# Patient Record
Sex: Female | Born: 1965 | Race: Black or African American | Hispanic: No | Marital: Married | Smoking: Never smoker
Health system: Southern US, Community
[De-identification: ages and names within clinical notes are randomized; demographics above are authoritative.]

## PROBLEM LIST (undated history)

## (undated) DIAGNOSIS — D649 Anemia, unspecified: Secondary | ICD-10-CM

## (undated) HISTORY — DX: Anemia, unspecified: D64.9

## (undated) HISTORY — PX: BACK SURGERY: SHX140

## (undated) HISTORY — PX: TONSILLECTOMY: SUR1361

---

## 1998-05-05 ENCOUNTER — Encounter: Payer: Self-pay | Admitting: Family Medicine

## 1998-05-05 ENCOUNTER — Observation Stay (HOSPITAL_COMMUNITY): Admission: AD | Admit: 1998-05-05 | Discharge: 1998-05-06 | Payer: Self-pay | Admitting: Family Medicine

## 2001-10-11 ENCOUNTER — Emergency Department (HOSPITAL_COMMUNITY): Admission: EM | Admit: 2001-10-11 | Discharge: 2001-10-11 | Payer: Self-pay | Admitting: Emergency Medicine

## 2005-06-22 ENCOUNTER — Emergency Department (HOSPITAL_COMMUNITY): Admission: EM | Admit: 2005-06-22 | Discharge: 2005-06-22 | Payer: Self-pay | Admitting: Emergency Medicine

## 2005-07-07 ENCOUNTER — Ambulatory Visit: Payer: Self-pay | Admitting: Orthopedic Surgery

## 2007-04-03 ENCOUNTER — Encounter: Admission: RE | Admit: 2007-04-03 | Discharge: 2007-04-03 | Payer: Self-pay | Admitting: Neurosurgery

## 2007-04-04 ENCOUNTER — Emergency Department (HOSPITAL_COMMUNITY): Admission: EM | Admit: 2007-04-04 | Discharge: 2007-04-05 | Payer: Self-pay | Admitting: Emergency Medicine

## 2007-04-11 ENCOUNTER — Ambulatory Visit (HOSPITAL_COMMUNITY): Admission: RE | Admit: 2007-04-11 | Discharge: 2007-04-12 | Payer: Self-pay | Admitting: Neurosurgery

## 2007-04-21 ENCOUNTER — Emergency Department (HOSPITAL_COMMUNITY): Admission: EM | Admit: 2007-04-21 | Discharge: 2007-04-21 | Payer: Self-pay | Admitting: Emergency Medicine

## 2007-05-30 ENCOUNTER — Encounter: Admission: RE | Admit: 2007-05-30 | Discharge: 2007-05-30 | Payer: Self-pay | Admitting: Neurosurgery

## 2007-06-05 ENCOUNTER — Ambulatory Visit (HOSPITAL_COMMUNITY): Admission: RE | Admit: 2007-06-05 | Discharge: 2007-06-05 | Payer: Self-pay | Admitting: Family Medicine

## 2008-07-21 IMAGING — RF DG CERVICAL SPINE 2 OR 3 VIEWS
1 series · 1 of 1 positions shown · non-contrast
Comparison: none

CLINICAL DATA: HNP, C5-6 ACDF.
 CERVICAL SPINE:

[Series 1: run · 1 of 1 slices shown]
[im 1/1]
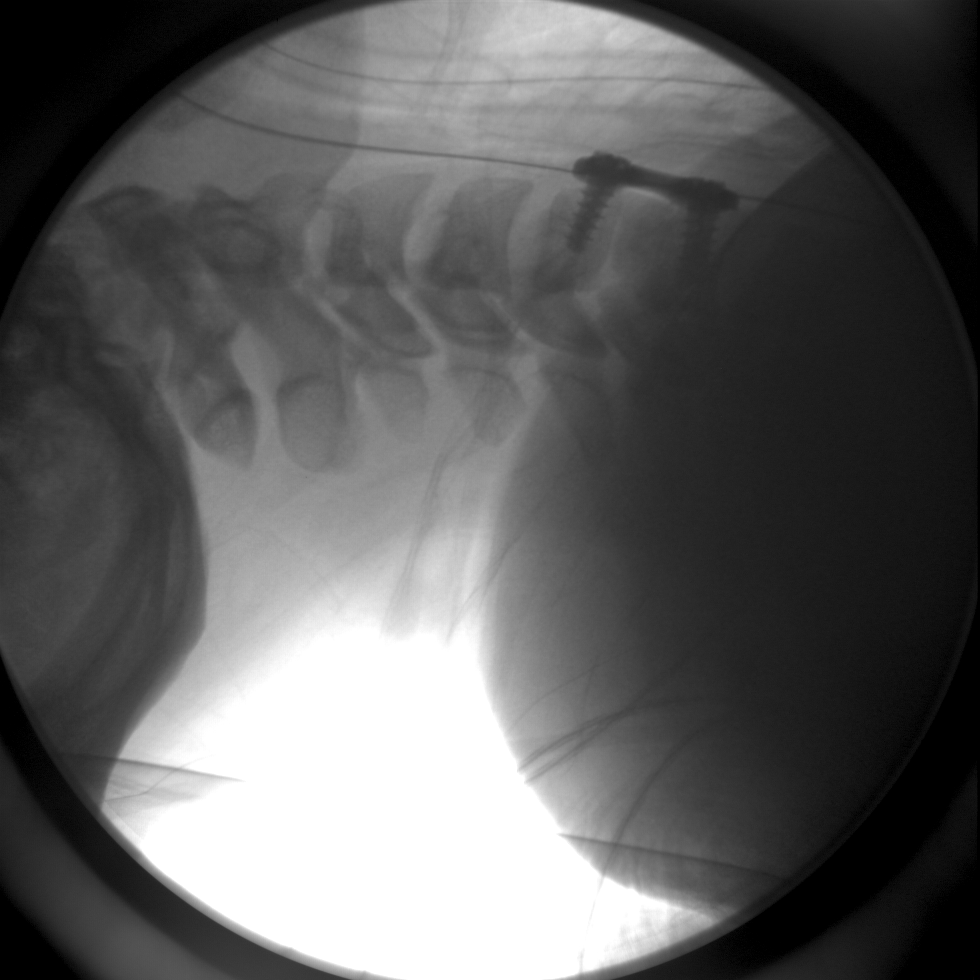

[1 of 1 positions shown; findings below may reference images not displayed]

FINDINGS: Anterior plate and screw fixation hardware and interbody fusion plug have been placed at C5-6.  Alignment appears satisfactory.
IMPRESSION: Status post ACDF at C5-6 with anterior plate and screw fixation hardware and IBF plug/spacer.

## 2008-08-23 ENCOUNTER — Emergency Department (HOSPITAL_COMMUNITY): Admission: EM | Admit: 2008-08-23 | Discharge: 2008-08-24 | Payer: Self-pay | Admitting: Emergency Medicine

## 2008-09-04 ENCOUNTER — Encounter: Admission: RE | Admit: 2008-09-04 | Discharge: 2008-10-02 | Payer: Self-pay | Admitting: Family Medicine

## 2008-10-04 ENCOUNTER — Emergency Department (HOSPITAL_COMMUNITY): Admission: EM | Admit: 2008-10-04 | Discharge: 2008-10-04 | Payer: Self-pay | Admitting: Emergency Medicine

## 2009-04-15 ENCOUNTER — Encounter: Payer: Self-pay | Admitting: Emergency Medicine

## 2009-04-15 ENCOUNTER — Ambulatory Visit: Payer: Self-pay | Admitting: Internal Medicine

## 2009-04-15 ENCOUNTER — Inpatient Hospital Stay (HOSPITAL_COMMUNITY): Admission: EM | Admit: 2009-04-15 | Discharge: 2009-04-17 | Payer: Self-pay | Admitting: Pediatrics

## 2009-04-16 ENCOUNTER — Encounter (INDEPENDENT_AMBULATORY_CARE_PROVIDER_SITE_OTHER): Payer: Self-pay | Admitting: Pediatrics

## 2009-04-16 ENCOUNTER — Ambulatory Visit: Payer: Self-pay | Admitting: Surgery

## 2009-04-25 ENCOUNTER — Inpatient Hospital Stay (HOSPITAL_COMMUNITY): Admission: EM | Admit: 2009-04-25 | Discharge: 2009-04-27 | Payer: Self-pay | Admitting: Emergency Medicine

## 2009-04-27 ENCOUNTER — Ambulatory Visit: Payer: Self-pay | Admitting: Psychiatry

## 2009-09-03 ENCOUNTER — Other Ambulatory Visit: Admission: RE | Admit: 2009-09-03 | Discharge: 2009-09-03 | Payer: Self-pay | Admitting: Family Medicine

## 2009-10-13 ENCOUNTER — Encounter: Admission: RE | Admit: 2009-10-13 | Discharge: 2009-10-13 | Payer: Self-pay | Admitting: Family Medicine

## 2010-09-09 LAB — CBC
HCT: 31 % — ABNORMAL LOW (ref 36.0–46.0)
HCT: 33.4 % — ABNORMAL LOW (ref 36.0–46.0)
HCT: 31.8 % — ABNORMAL LOW (ref 36.0–46.0)
Hemoglobin: 10.4 g/dL — ABNORMAL LOW (ref 12.0–15.0)
Hemoglobin: 11.3 g/dL — ABNORMAL LOW (ref 12.0–15.0)
Hemoglobin: 10.9 g/dL — ABNORMAL LOW (ref 12.0–15.0)
MCHC: 33.5 g/dL (ref 30.0–36.0)
MCHC: 33.7 g/dL (ref 30.0–36.0)
MCHC: 34.3 g/dL (ref 30.0–36.0)
MCV: 86.4 fL (ref 78.0–100.0)
MCV: 86.8 fL (ref 78.0–100.0)
MCV: 84.1 fL (ref 78.0–100.0)
Platelets: 262 10*3/uL (ref 150–400)
Platelets: 291 10*3/uL (ref 150–400)
Platelets: 292 10*3/uL (ref 150–400)
RBC: 3.57 MIL/uL — ABNORMAL LOW (ref 3.87–5.11)
RBC: 3.87 MIL/uL (ref 3.87–5.11)
RBC: 3.78 MIL/uL — ABNORMAL LOW (ref 3.87–5.11)
RDW: 17.4 % — ABNORMAL HIGH (ref 11.5–15.5)
RDW: 17.6 % — ABNORMAL HIGH (ref 11.5–15.5)
RDW: 16.7 % — ABNORMAL HIGH (ref 11.5–15.5)
WBC: 5.1 10*3/uL (ref 4.0–10.5)
WBC: 6.8 10*3/uL (ref 4.0–10.5)
WBC: 6.4 10*3/uL (ref 4.0–10.5)

## 2010-09-09 LAB — BASIC METABOLIC PANEL
BUN: 7 mg/dL (ref 6–23)
BUN: 8 mg/dL (ref 6–23)
BUN: 5 mg/dL — ABNORMAL LOW (ref 6–23)
CO2: 24 mEq/L (ref 19–32)
CO2: 25 mEq/L (ref 19–32)
CO2: 20 mEq/L (ref 19–32)
Calcium: 8.3 mg/dL — ABNORMAL LOW (ref 8.4–10.5)
Calcium: 8.4 mg/dL (ref 8.4–10.5)
Calcium: 8 mg/dL — ABNORMAL LOW (ref 8.4–10.5)
Chloride: 111 mEq/L (ref 96–112)
Chloride: 113 mEq/L — ABNORMAL HIGH (ref 96–112)
Chloride: 107 mEq/L (ref 96–112)
Creatinine, Ser: 0.7 mg/dL (ref 0.4–1.2)
Creatinine, Ser: 0.71 mg/dL (ref 0.4–1.2)
Creatinine, Ser: 0.67 mg/dL (ref 0.4–1.2)
GFR calc Af Amer: >60 mL/min (ref >60)
GFR calc Af Amer: >60 mL/min (ref >60)
GFR calc Af Amer: >60 mL/min (ref >60)
GFR calc non Af Amer: >60 mL/min (ref >60)
GFR calc non Af Amer: >60 mL/min (ref >60)
GFR calc non Af Amer: >60 mL/min (ref >60)
Glucose, Bld: 117 mg/dL — ABNORMAL HIGH (ref 70–99)
Glucose, Bld: 91 mg/dL (ref 70–99)
Glucose, Bld: 77 mg/dL (ref 70–99)
Potassium: 3.7 mEq/L (ref 3.5–5.1)
Potassium: 4 mEq/L (ref 3.5–5.1)
Potassium: 3.3 mEq/L — ABNORMAL LOW (ref 3.5–5.1)
Sodium: 141 mEq/L (ref 135–145)
Sodium: 142 mEq/L (ref 135–145)
Sodium: 134 mEq/L — ABNORMAL LOW (ref 135–145)

## 2010-09-09 LAB — POCT I-STAT, CHEM 8
BUN: 7 mg/dL (ref 6–23)
Calcium, Ion: 0.94 mmol/L — ABNORMAL LOW (ref 1.12–1.32)
Chloride: 110 mEq/L (ref 96–112)
Creatinine, Ser: 0.7 mg/dL (ref 0.4–1.2)
Glucose, Bld: 114 mg/dL — ABNORMAL HIGH (ref 70–99)
HCT: 34 % — ABNORMAL LOW (ref 36.0–46.0)
Hemoglobin: 11.6 g/dL — ABNORMAL LOW (ref 12.0–15.0)
Potassium: 3.1 mEq/L — ABNORMAL LOW (ref 3.5–5.1)
Sodium: 142 mEq/L (ref 135–145)
TCO2: 19 mmol/L (ref 0–100)

## 2010-09-09 LAB — HEMOGLOBIN A1C
Hgb A1c MFr Bld: 5.5 % (ref 4.6–6.1)
Mean Plasma Glucose: 111 mg/dL

## 2010-09-09 LAB — CARDIAC PANEL(CRET KIN+CKTOT+MB+TROPI)
CK, MB: 0.6 ng/mL (ref 0.3–4.0)
CK, MB: 3 ng/mL (ref 0.3–4.0)
Relative Index: INVALID (ref 0.0–2.5)
Relative Index: INVALID (ref 0.0–2.5)
Total CK: 72 U/L (ref 7–177)
Total CK: 74 U/L (ref 7–177)
Troponin I: 0.01 ng/mL (ref 0.00–0.06)
Troponin I: 0.01 ng/mL (ref 0.00–0.06)

## 2010-09-09 LAB — LIPID PANEL
Cholesterol: 135 mg/dL (ref 0–200)
Cholesterol: 157 mg/dL (ref 0–200)
HDL: 33 mg/dL — ABNORMAL LOW (ref >39)
HDL: 42 mg/dL (ref >39)
LDL Cholesterol: 87 mg/dL (ref 0–99)
LDL Cholesterol: 101 mg/dL — ABNORMAL HIGH (ref 0–99)
Total CHOL/HDL Ratio: 4.1 RATIO
Total CHOL/HDL Ratio: 3.7 RATIO
Triglycerides: 75 mg/dL (ref <150)
Triglycerides: 69 mg/dL (ref <150)
VLDL: 15 mg/dL (ref 0–40)
VLDL: 14 mg/dL (ref 0–40)

## 2010-09-09 LAB — ETHANOL: Alcohol, Ethyl (B): <5 mg/dL (ref 0–10)

## 2010-09-09 LAB — URINALYSIS, ROUTINE W REFLEX MICROSCOPIC
Bilirubin Urine: NEGATIVE
Bilirubin Urine: NEGATIVE
Glucose, UA: NEGATIVE mg/dL
Glucose, UA: NEGATIVE mg/dL
Hgb urine dipstick: NEGATIVE
Hgb urine dipstick: NEGATIVE
Ketones, ur: NEGATIVE mg/dL
Nitrite: NEGATIVE
Nitrite: NEGATIVE
Protein, ur: NEGATIVE mg/dL
Protein, ur: NEGATIVE mg/dL
Specific Gravity, Urine: 1.011 (ref 1.005–1.030)
Specific Gravity, Urine: >1.03 — ABNORMAL HIGH (ref 1.005–1.030)
Urobilinogen, UA: 1 mg/dL (ref 0.0–1.0)
Urobilinogen, UA: 0.2 mg/dL (ref 0.0–1.0)
pH: 7.5 (ref 5.0–8.0)
pH: 5.5 (ref 5.0–8.0)

## 2010-09-09 LAB — DIFFERENTIAL
Basophils Absolute: 0.1 10*3/uL (ref 0.0–0.1)
Basophils Absolute: 0 10*3/uL (ref 0.0–0.1)
Basophils Relative: 1 % (ref 0–1)
Basophils Relative: 1 % (ref 0–1)
Eosinophils Absolute: 0.1 10*3/uL (ref 0.0–0.7)
Eosinophils Absolute: 0.1 10*3/uL (ref 0.0–0.7)
Eosinophils Relative: 2 % (ref 0–5)
Eosinophils Relative: 2 % (ref 0–5)
Lymphocytes Relative: 38 % (ref 12–46)
Lymphocytes Relative: 40 % (ref 12–46)
Lymphs Abs: 2.6 10*3/uL (ref 0.7–4.0)
Lymphs Abs: 2.6 10*3/uL (ref 0.7–4.0)
Monocytes Absolute: 0.6 10*3/uL (ref 0.1–1.0)
Monocytes Absolute: 0.6 10*3/uL (ref 0.1–1.0)
Monocytes Relative: 9 % (ref 3–12)
Monocytes Relative: 9 % (ref 3–12)
Neutro Abs: 3.4 10*3/uL (ref 1.7–7.7)
Neutro Abs: 3.1 10*3/uL (ref 1.7–7.7)
Neutrophils Relative %: 50 % (ref 43–77)
Neutrophils Relative %: 49 % (ref 43–77)

## 2010-09-09 LAB — PHOSPHORUS
Phosphorus: 1.8 mg/dL — ABNORMAL LOW (ref 2.3–4.6)
Phosphorus: 2.7 mg/dL (ref 2.3–4.6)

## 2010-09-09 LAB — CK TOTAL AND CKMB (NOT AT ARMC)
CK, MB: 0.8 ng/mL (ref 0.3–4.0)
Relative Index: INVALID (ref 0.0–2.5)
Total CK: 89 U/L (ref 7–177)

## 2010-09-09 LAB — RAPID URINE DRUG SCREEN, HOSP PERFORMED
Amphetamines: NOT DETECTED
Amphetamines: NOT DETECTED
Barbiturates: NOT DETECTED
Barbiturates: NOT DETECTED
Benzodiazepines: NOT DETECTED
Benzodiazepines: NOT DETECTED
Cocaine: NOT DETECTED
Cocaine: NOT DETECTED
Opiates: NOT DETECTED
Opiates: NOT DETECTED
Tetrahydrocannabinol: NOT DETECTED
Tetrahydrocannabinol: NOT DETECTED

## 2010-09-09 LAB — POCT CARDIAC MARKERS
CKMB, poc: <1 ng/mL — ABNORMAL LOW (ref 1.0–8.0)
Myoglobin, poc: 48.3 ng/mL (ref 12–200)
Troponin i, poc: <0.05 ng/mL (ref 0.00–0.09)

## 2010-09-09 LAB — COMPREHENSIVE METABOLIC PANEL
ALT: 14 U/L (ref 0–35)
AST: 16 U/L (ref 0–37)
Albumin: 3.4 g/dL — ABNORMAL LOW (ref 3.5–5.2)
Alkaline Phosphatase: 68 U/L (ref 39–117)
BUN: 10 mg/dL (ref 6–23)
CO2: 26 mEq/L (ref 19–32)
Calcium: 8.6 mg/dL (ref 8.4–10.5)
Chloride: 111 mEq/L (ref 96–112)
Creatinine, Ser: 0.84 mg/dL (ref 0.4–1.2)
GFR calc Af Amer: >60 mL/min (ref >60)
GFR calc non Af Amer: >60 mL/min (ref >60)
Glucose, Bld: 82 mg/dL (ref 70–99)
Potassium: 3.2 mEq/L — ABNORMAL LOW (ref 3.5–5.1)
Sodium: 140 mEq/L (ref 135–145)
Total Bilirubin: 0.2 mg/dL — ABNORMAL LOW (ref 0.3–1.2)
Total Protein: 6.6 g/dL (ref 6.0–8.3)

## 2010-09-09 LAB — PROTIME-INR
INR: 1.03 (ref 0.00–1.49)
INR: 1.05 (ref 0.00–1.49)
Prothrombin Time: 13.4 seconds (ref 11.6–15.2)
Prothrombin Time: 13.6 seconds (ref 11.6–15.2)

## 2010-09-09 LAB — MRSA PCR SCREENING: MRSA by PCR: NEGATIVE

## 2010-09-09 LAB — CALCIUM: Calcium: 8.3 mg/dL — ABNORMAL LOW (ref 8.4–10.5)

## 2010-09-09 LAB — MAGNESIUM
Magnesium: 2 mg/dL (ref 1.5–2.5)
Magnesium: 2 mg/dL (ref 1.5–2.5)

## 2010-09-09 LAB — SEDIMENTATION RATE
Sed Rate: 12 mm/hr (ref 0–22)
Sed Rate: 15 mm/hr (ref 0–22)

## 2010-09-09 LAB — APTT
aPTT: 29 seconds (ref 24–37)
aPTT: 29 seconds (ref 24–37)

## 2010-09-09 LAB — GLUCOSE, CAPILLARY
Glucose-Capillary: 103 mg/dL — ABNORMAL HIGH (ref 70–99)
Glucose-Capillary: 104 mg/dL — ABNORMAL HIGH (ref 70–99)
Glucose-Capillary: 90 mg/dL (ref 70–99)
Glucose-Capillary: 91 mg/dL (ref 70–99)

## 2010-09-09 LAB — POCT PREGNANCY, URINE: Preg Test, Ur: NEGATIVE

## 2010-09-09 LAB — TSH: TSH: 6.087 u[IU]/mL — ABNORMAL HIGH (ref 0.350–4.500)

## 2010-09-09 LAB — TROPONIN I: Troponin I: <0.01 ng/mL (ref 0.00–0.06)

## 2010-09-16 LAB — CBC
HCT: 32.7 % — ABNORMAL LOW (ref 36.0–46.0)
Hemoglobin: 10.9 g/dL — ABNORMAL LOW (ref 12.0–15.0)
MCHC: 33.3 g/dL (ref 30.0–36.0)
MCV: 84.2 fL (ref 78.0–100.0)
Platelets: 316 10*3/uL (ref 150–400)
RBC: 3.88 MIL/uL (ref 3.87–5.11)
RDW: 15.9 % — ABNORMAL HIGH (ref 11.5–15.5)
WBC: 5.3 10*3/uL (ref 4.0–10.5)

## 2010-09-16 LAB — DIFFERENTIAL
Basophils Absolute: 0.1 10*3/uL (ref 0.0–0.1)
Basophils Relative: 1 % (ref 0–1)
Eosinophils Absolute: 0.1 10*3/uL (ref 0.0–0.7)
Eosinophils Relative: 2 % (ref 0–5)
Lymphocytes Relative: 38 % (ref 12–46)
Lymphs Abs: 2 10*3/uL (ref 0.7–4.0)
Monocytes Absolute: 0.4 10*3/uL (ref 0.1–1.0)
Monocytes Relative: 7 % (ref 3–12)
Neutro Abs: 2.7 10*3/uL (ref 1.7–7.7)
Neutrophils Relative %: 52 % (ref 43–77)

## 2010-09-16 LAB — COMPREHENSIVE METABOLIC PANEL
ALT: 14 U/L (ref 0–35)
AST: 15 U/L (ref 0–37)
Albumin: 3.5 g/dL (ref 3.5–5.2)
Alkaline Phosphatase: 78 U/L (ref 39–117)
BUN: 10 mg/dL (ref 6–23)
CO2: 26 mEq/L (ref 19–32)
Calcium: 8.6 mg/dL (ref 8.4–10.5)
Chloride: 108 mEq/L (ref 96–112)
Creatinine, Ser: 0.76 mg/dL (ref 0.4–1.2)
GFR calc Af Amer: >60 mL/min (ref >60)
GFR calc non Af Amer: >60 mL/min (ref >60)
Glucose, Bld: 88 mg/dL (ref 70–99)
Potassium: 3.5 mEq/L (ref 3.5–5.1)
Sodium: 140 mEq/L (ref 135–145)
Total Bilirubin: 0.6 mg/dL (ref 0.3–1.2)
Total Protein: 7.3 g/dL (ref 6.0–8.3)

## 2010-09-16 LAB — D-DIMER, QUANTITATIVE: D-Dimer, Quant: 0.22 ug/mL-FEU (ref 0.00–0.48)

## 2010-09-16 LAB — POCT CARDIAC MARKERS
CKMB, poc: <1 ng/mL — ABNORMAL LOW (ref 1.0–8.0)
Myoglobin, poc: 39.7 ng/mL (ref 12–200)
Troponin i, poc: <0.05 ng/mL (ref 0.00–0.09)

## 2010-10-20 NOTE — Op Note (Signed)
[name], [name]                 ACCOUNT NO.:  [id number]   MEDICAL RECORD NO.:  [id number]          PATIENT TYPE:  OIB   LOCATION:  5041                         FACILITY:  [facility]   PHYSICIAN:  Henry A. Pool, M.D.    DATE OF BIRTH:  08-01-1965   DATE OF PROCEDURE:  04/12/2007  DATE OF DISCHARGE:                               OPERATIVE REPORT   SERVICE:  Neurosurgery.   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   PROCEDURE NOTE:  C5-6 anterior cervical diskectomy fusion with allograft  anterior plating.   SURGEON:  Kathaleen Maser. Pool, M.D.   ASSISTANT:  Reinaldo Meeker, M.D.   ANESTHESIA:  General orotracheal.   INDICATIONS:  [name] is a 45 year old female with history of severe  neck and left upper extremity pain, paresthesias, and weakness.  Workup  demonstrates evidence of a large, left-sided C5-6 disk herniation with  marked spinal cord compression.  The patient has been counseled as to  her options.  She decided proceed with a C5-6 anterior cervical  discectomy-fusion with allograft anterior plating in the hopes of  improving her symptoms.   OPERATING NOTE:  The patient was brought to the operating room and  placed on the operating table in the supine position.  After an adequate  level of anesthesia was achieved the patient was turned supine and neck  was extended, and held in place with halter traction.  The patient's  anterior cervical region was prepped and draped sterilely.   A 10-blade was used to make a skin incision overlying the C5-6  interspace.  This was carried down sharply to the platysma.  The  platysma was divided vertically, and dissection was extended on the  medial border of the sternomastoid muscle and carotid sheath.  Trachea  and esophagus were mobilized and tracked towards the left.  Prevertebral  fascia was stripped off the anterior spinal  column.  Longus colli muscle  was elevated bilaterally using electrocautery.  Deep self-retractor was  placed.  Intraoperative fluoroscopy was used and the level was  confirmed.   Disk space at C5-6 then incised with a 15-blade in a standard fashion  and a wide disk space plane was then achieved using pituitary rongeurs  four in the background __________  curettes, Kerrison rongeurs, and a  high-speed drill.   Elements of the disk were removed down close to the posterior annulus  where microscope was brought up in the field and used throughout the  remainder of the discectomy.  The remaining aspects of the annulus and  osteophytes were removed using high-speed drill down to the level of the  posterior __________ .  The posterior __________  was elevated and  resected in the usual fashion using Kerrison rongeurs.  A large amount  of free disk herniation was encountered and completely resected.   Decompression then proceeded throughout the canal by undercutting the  bodies of C5 and C6 for decompression that presented in each neural  foramen.  Anterior foraminotomy was then performed  through the course of  the exiting C6 nerve roots bilaterally.  At this point, a very thorough  decompression was achieved.  There was no injury to the thecal sac and  nerve roots appeared through the length of disk herniation completely  resected.   The wound was then irrigated with antibiotic solution.  A 6 mm Life  allograft was then packed into place successfully with a 1 mm of  anterior cortical margin.  A 25 mm Atlantis anterior plate was then  placed over the C5 and C6 levels.  This was then attached under  fluoroscopic guidance using 13 mm variable screws to each at both levels  with all 4 screws, given a final tightening __________  in bone.  A  locking screw was engaged in both levels.  Final images revealed good  position __________  normal spine.  The wound was then irrigated one  final time.   Hemostasis was ensured with bipolar electrocautery.  The  wound was then closed in layers.  Sterile dressing was applied.  There  were no intraoperative complications.  The patient tolerated the  procedure well, and she returns to the recovery room postoperatively in  good condition.           ______________________________  Kathaleen Maser Pool, M.D.     HAP/MEDQ  D:  04/11/2007  T:  04/12/2007  Job:  [id number]

## 2011-01-06 ENCOUNTER — Other Ambulatory Visit (HOSPITAL_COMMUNITY): Payer: Self-pay | Admitting: Family Medicine

## 2011-01-06 DIAGNOSIS — Z139 Encounter for screening, unspecified: Secondary | ICD-10-CM

## 2011-01-18 ENCOUNTER — Ambulatory Visit (HOSPITAL_COMMUNITY)
Admission: RE | Admit: 2011-01-18 | Discharge: 2011-01-18 | Disposition: A | Discharge status: Home / Self Care | Payer: BC Managed Care – PPO | Source: Ambulatory Visit | Attending: Family Medicine | Admitting: Family Medicine

## 2011-01-18 DIAGNOSIS — Z1231 Encounter for screening mammogram for malignant neoplasm of breast: Secondary | ICD-10-CM | POA: Insufficient documentation

## 2011-01-18 DIAGNOSIS — Z139 Encounter for screening, unspecified: Secondary | ICD-10-CM

## 2011-03-16 LAB — CBC
HCT: 34.8 — ABNORMAL LOW
HCT: 34.1 — ABNORMAL LOW
Hemoglobin: 11.4 — ABNORMAL LOW
Hemoglobin: 11.2 — ABNORMAL LOW
MCHC: 32.8
MCHC: 32.9
MCV: 85.1
MCV: 84.9
Platelets: 392
Platelets: 313
RBC: 4.09
RBC: 4.02
RDW: 16.6 — ABNORMAL HIGH
RDW: 16.9 — ABNORMAL HIGH
WBC: 5.6
WBC: 6.3

## 2011-03-16 LAB — DIFFERENTIAL
Basophils Absolute: 0
Basophils Absolute: 0.1
Basophils Relative: 1
Basophils Relative: 1
Eosinophils Absolute: 0.1 — ABNORMAL LOW
Eosinophils Absolute: 0.2
Eosinophils Relative: 2
Eosinophils Relative: 3
Lymphocytes Relative: 26
Lymphocytes Relative: 27
Lymphs Abs: 1.4
Lymphs Abs: 1.7
Monocytes Absolute: 0.4
Monocytes Absolute: 0.5
Monocytes Relative: 8
Monocytes Relative: 8
Neutro Abs: 3.6
Neutro Abs: 3.9
Neutrophils Relative %: 64
Neutrophils Relative %: 62

## 2011-03-16 LAB — I-STAT 8, (EC8 V) (CONVERTED LAB)
Acid-base deficit: 2
BUN: 9
Bicarbonate: 22.2
Chloride: 108
Glucose, Bld: 81
HCT: 38
Hemoglobin: 12.9
Operator id: 267321
Potassium: 3.8
Sodium: 140
TCO2: 23
pCO2, Ven: 36.2 — ABNORMAL LOW
pH, Ven: 7.395 — ABNORMAL HIGH

## 2011-03-16 LAB — TYPE AND SCREEN
ABO/RH(D): O POS
Antibody Screen: NEGATIVE

## 2011-03-16 LAB — POCT CARDIAC MARKERS
CKMB, poc: <1 — ABNORMAL LOW
Myoglobin, poc: 38.8
Operator id: 267321
Troponin i, poc: <0.05

## 2011-03-16 LAB — POCT I-STAT CREATININE
Creatinine, Ser: 0.7
Operator id: 267321

## 2011-03-16 LAB — ABO/RH: ABO/RH(D): O POS

## 2011-03-16 LAB — B-NATRIURETIC PEPTIDE (CONVERTED LAB): Pro B Natriuretic peptide (BNP): <30

## 2011-03-17 LAB — DIFFERENTIAL
Basophils Absolute: 0
Basophils Relative: 0
Eosinophils Absolute: 0
Eosinophils Relative: 0
Lymphocytes Relative: 3 — ABNORMAL LOW
Lymphs Abs: 0.3 — ABNORMAL LOW
Monocytes Absolute: 0 — ABNORMAL LOW
Monocytes Relative: 0 — ABNORMAL LOW
Neutro Abs: 10.6 — ABNORMAL HIGH
Neutrophils Relative %: 97 — ABNORMAL HIGH

## 2011-03-17 LAB — URINALYSIS, ROUTINE W REFLEX MICROSCOPIC
Bilirubin Urine: NEGATIVE
Glucose, UA: >1000 — AB
Leukocytes, UA: NEGATIVE
Nitrite: NEGATIVE
Protein, ur: NEGATIVE
Specific Gravity, Urine: 1.039 — ABNORMAL HIGH
Urobilinogen, UA: 0.2
pH: 5.5

## 2011-03-17 LAB — CBC
HCT: 35.2 — ABNORMAL LOW
Hemoglobin: 11.6 — ABNORMAL LOW
MCHC: 33.1
MCV: 85.2
Platelets: 347
RBC: 4.13
RDW: 16.3 — ABNORMAL HIGH
WBC: 11 — ABNORMAL HIGH

## 2011-03-17 LAB — BASIC METABOLIC PANEL
BUN: 10
CO2: 17 — ABNORMAL LOW
Calcium: 7.6 — ABNORMAL LOW
Chloride: 104
Creatinine, Ser: 0.87
GFR calc Af Amer: >60
GFR calc non Af Amer: >60
Glucose, Bld: 270 — ABNORMAL HIGH
Potassium: 4
Sodium: 134 — ABNORMAL LOW

## 2011-03-17 LAB — URINE MICROSCOPIC-ADD ON

## 2011-03-17 LAB — POCT CARDIAC MARKERS
CKMB, poc: <1 — ABNORMAL LOW
Myoglobin, poc: 23.2
Operator id: 1192
Troponin i, poc: <0.05

## 2011-03-17 LAB — PREGNANCY, URINE: Preg Test, Ur: NEGATIVE

## 2011-03-17 LAB — D-DIMER, QUANTITATIVE: D-Dimer, Quant: 0.47

## 2012-05-17 ENCOUNTER — Encounter (HOSPITAL_COMMUNITY): Payer: Self-pay | Admitting: *Deleted

## 2012-05-17 ENCOUNTER — Emergency Department (HOSPITAL_COMMUNITY)
Admission: EM | Admit: 2012-05-17 | Discharge: 2012-05-17 | Disposition: A | Discharge status: Home / Self Care | Payer: BC Managed Care – PPO | Attending: Emergency Medicine | Admitting: Emergency Medicine

## 2012-05-17 ENCOUNTER — Emergency Department (HOSPITAL_COMMUNITY): Payer: BC Managed Care – PPO

## 2012-05-17 DIAGNOSIS — R0602 Shortness of breath: Secondary | ICD-10-CM | POA: Insufficient documentation

## 2012-05-17 DIAGNOSIS — R0789 Other chest pain: Secondary | ICD-10-CM

## 2012-05-17 DIAGNOSIS — M549 Dorsalgia, unspecified: Secondary | ICD-10-CM | POA: Insufficient documentation

## 2012-05-17 DIAGNOSIS — R071 Chest pain on breathing: Secondary | ICD-10-CM | POA: Insufficient documentation

## 2012-05-17 LAB — CBC WITH DIFFERENTIAL/PLATELET
Basophils Absolute: 0 10*3/uL (ref 0.0–0.1)
Basophils Relative: 0 % (ref 0–1)
Eosinophils Absolute: 0.1 10*3/uL (ref 0.0–0.7)
Eosinophils Relative: 2 % (ref 0–5)
HCT: 35 % — ABNORMAL LOW (ref 36.0–46.0)
Hemoglobin: 11.7 g/dL — ABNORMAL LOW (ref 12.0–15.0)
Lymphocytes Relative: 31 % (ref 12–46)
Lymphs Abs: 1.8 10*3/uL (ref 0.7–4.0)
MCH: 29 pg (ref 26.0–34.0)
MCHC: 33.4 g/dL (ref 30.0–36.0)
MCV: 86.8 fL (ref 78.0–100.0)
Monocytes Absolute: 0.5 10*3/uL (ref 0.1–1.0)
Monocytes Relative: 8 % (ref 3–12)
Neutro Abs: 3.4 10*3/uL (ref 1.7–7.7)
Neutrophils Relative %: 58 % (ref 43–77)
Platelets: 273 10*3/uL (ref 150–400)
RBC: 4.03 MIL/uL (ref 3.87–5.11)
RDW: 14.1 % (ref 11.5–15.5)
WBC: 5.9 10*3/uL (ref 4.0–10.5)

## 2012-05-17 LAB — COMPREHENSIVE METABOLIC PANEL
ALT: 12 U/L (ref 0–35)
AST: 13 U/L (ref 0–37)
Albumin: 3.4 g/dL — ABNORMAL LOW (ref 3.5–5.2)
Alkaline Phosphatase: 88 U/L (ref 39–117)
BUN: 14 mg/dL (ref 6–23)
CO2: 26 mEq/L (ref 19–32)
Calcium: 8.5 mg/dL (ref 8.4–10.5)
Chloride: 107 mEq/L (ref 96–112)
Creatinine, Ser: 0.84 mg/dL (ref 0.50–1.10)
GFR calc Af Amer: >90 mL/min (ref >90)
GFR calc non Af Amer: 83 mL/min — ABNORMAL LOW (ref >90)
Glucose, Bld: 88 mg/dL (ref 70–99)
Potassium: 3.4 mEq/L — ABNORMAL LOW (ref 3.5–5.1)
Sodium: 140 mEq/L (ref 135–145)
Total Bilirubin: 0.2 mg/dL — ABNORMAL LOW (ref 0.3–1.2)
Total Protein: 7.2 g/dL (ref 6.0–8.3)

## 2012-05-17 LAB — TROPONIN I: Troponin I: <0.3 ng/mL (ref <0.30)

## 2012-05-17 MED ORDER — TRAMADOL HCL 50 MG PO TABS: 50.0000 mg | ORAL_TABLET | Freq: Four times a day (QID) | ORAL | Status: DC | PRN

## 2012-05-17 NOTE — ED Provider Notes (Signed)
History     CSN: [id number]  Arrival date & time 05/17/12  2013   First MD Initiated Contact with Patient 05/17/12 2029      Chief Complaint  Patient presents with  . Arm Pain  . Back Pain    (Consider location/radiation/quality/duration/timing/severity/associated sxs/prior treatment) Patient is a 46 y.o. female presenting with arm pain and back pain. The history is provided by the patient (the pt complains of having left arm pain and chest pain for one week). No language interpreter was used.  Arm Pain This is a new problem. The current episode started 2 days ago. The problem occurs rarely. The problem has been resolved. Associated symptoms include chest pain. Pertinent negatives include no abdominal pain and no headaches. The symptoms are aggravated by twisting. Nothing relieves the symptoms. The treatment provided no relief.  Back Pain  Associated symptoms include chest pain. Pertinent negatives include no headaches and no abdominal pain.    History reviewed. No pertinent past medical history.  Past Surgical History  Procedure Date  . Back surgery   . Tonsillectomy     No family history on file.  History  Substance Use Topics  . Smoking status: Never Smoker   . Smokeless tobacco: Not on file  . Alcohol Use: No    OB History    Grav Para Term Preterm Abortions TAB SAB Ect Mult Living                  Review of Systems  Constitutional: Negative for fatigue.  HENT: Negative for congestion, sinus pressure and ear discharge.   Eyes: Negative for discharge.  Respiratory: Negative for cough.   Cardiovascular: Positive for chest pain.  Gastrointestinal: Negative for abdominal pain and diarrhea.  Genitourinary: Negative for frequency and hematuria.  Musculoskeletal: Positive for back pain.  Skin: Negative for rash.  Neurological: Negative for seizures and headaches.  Hematological: Negative.   Psychiatric/Behavioral: Negative for hallucinations.    Allergies   Review of patient's allergies indicates no known allergies.  Home Medications  No current outpatient prescriptions on file.  BP 135/87  Pulse 72  Temp 97.7 F (36.5 C) (Oral)  Resp 20  Ht 5' 5.5" (1.664 m)  Wt 195 lb (88.451 kg)  BMI 31.96 kg/m2  SpO2 100%  LMP 05/03/2012  Physical Exam  Constitutional: She is oriented to person, place, and time. She appears well-developed.  HENT:  Head: Normocephalic and atraumatic.  Eyes: Conjunctivae normal and EOM are normal. No scleral icterus.  Neck: Neck supple. No thyromegaly present.  Cardiovascular: Normal rate and regular rhythm.  Exam reveals no gallop and no friction rub.   No murmur heard. Pulmonary/Chest: No stridor. She has no wheezes. She has no rales. She exhibits tenderness.  Abdominal: She exhibits no distension. There is no tenderness. There is no rebound.  Musculoskeletal: Normal range of motion. She exhibits no edema.  Lymphadenopathy:    She has no cervical adenopathy.  Neurological: She is oriented to person, place, and time. Coordination normal.  Skin: No rash noted. No erythema.  Psychiatric: She has a normal mood and affect. Her behavior is normal.    ED Course  Procedures (including critical care time)  Labs Reviewed  CBC WITH DIFFERENTIAL - Abnormal; Notable for the following:    Hemoglobin 11.7 (*)     HCT 35.0 (*)     All other components within normal limits  COMPREHENSIVE METABOLIC PANEL  TROPONIN I   Dg Chest Port 1 View  05/17/2012  *  RADIOLOGY REPORT*  Clinical Data: Left-sided chest pain.  Radiations into the left arm.  PORTABLE CHEST - 1 VIEW  Comparison: 04/25/2009.  Findings:  Cardiopericardial silhouette within normal limits. Mediastinal contours normal. Trachea midline.  No airspace disease or effusion. No pneumothorax.  IMPRESSION: No active cardiopulmonary disease.   Original Report Authenticated By: Andreas Newport, M.D.      No diagnosis found.   Date: 05/17/2012  Rate: 64  Rhythm:  normal sinus rhythm  QRS Axis: normal  Intervals: normal  ST/T Wave abnormalities:normal  Conduction Disutrbances:none  Narrative Interpretation:   Old EKG Reviewed: none available    MDM          Benny Lennert, MD 05/17/12 2134

## 2012-05-17 NOTE — ED Notes (Addendum)
Pt reporting pain in left shoulder, arm, side and into back.  Reports symptoms intermittent for about 2 weeks, worse today.  Reports SOB with increased pain.  Denies nausea, vomiting, or additional symptoms. No distress noted

## 2012-05-17 NOTE — Discharge Instructions (Signed)
Follow up with your md in 1 week if not improving.

## 2013-12-20 ENCOUNTER — Other Ambulatory Visit (HOSPITAL_COMMUNITY): Payer: Self-pay | Admitting: Family Medicine

## 2013-12-20 DIAGNOSIS — Z1231 Encounter for screening mammogram for malignant neoplasm of breast: Secondary | ICD-10-CM

## 2013-12-26 ENCOUNTER — Ambulatory Visit (HOSPITAL_COMMUNITY)
Admission: RE | Admit: 2013-12-26 | Discharge: 2013-12-26 | Disposition: A | Discharge status: Home / Self Care | Payer: BC Managed Care – PPO | Source: Ambulatory Visit | Attending: Family Medicine | Admitting: Family Medicine

## 2013-12-26 DIAGNOSIS — Z1231 Encounter for screening mammogram for malignant neoplasm of breast: Secondary | ICD-10-CM | POA: Insufficient documentation

## 2014-08-15 ENCOUNTER — Other Ambulatory Visit (HOSPITAL_COMMUNITY)
Admission: RE | Admit: 2014-08-15 | Discharge: 2014-08-15 | Disposition: A | Discharge status: Home / Self Care | Payer: BC Managed Care – PPO | Source: Ambulatory Visit | Attending: Family Medicine | Admitting: Family Medicine

## 2014-08-15 DIAGNOSIS — Z01419 Encounter for gynecological examination (general) (routine) without abnormal findings: Secondary | ICD-10-CM | POA: Insufficient documentation

## 2014-08-15 DIAGNOSIS — Z1151 Encounter for screening for human papillomavirus (HPV): Secondary | ICD-10-CM | POA: Insufficient documentation

## 2014-08-16 ENCOUNTER — Other Ambulatory Visit: Payer: Self-pay | Admitting: Family Medicine

## 2014-08-16 DIAGNOSIS — Z1231 Encounter for screening mammogram for malignant neoplasm of breast: Secondary | ICD-10-CM

## 2014-09-05 ENCOUNTER — Emergency Department (HOSPITAL_COMMUNITY)
Admission: EM | Admit: 2014-09-05 | Discharge: 2014-09-06 | Disposition: A | Discharge status: Home / Self Care | Payer: BC Managed Care – PPO | Attending: Emergency Medicine | Admitting: Emergency Medicine

## 2014-09-05 ENCOUNTER — Encounter (HOSPITAL_COMMUNITY): Payer: Self-pay | Admitting: *Deleted

## 2014-09-05 ENCOUNTER — Emergency Department (HOSPITAL_COMMUNITY): Payer: BC Managed Care – PPO

## 2014-09-05 DIAGNOSIS — W01198A Fall on same level from slipping, tripping and stumbling with subsequent striking against other object, initial encounter: Secondary | ICD-10-CM | POA: Insufficient documentation

## 2014-09-05 DIAGNOSIS — G44319 Acute post-traumatic headache, not intractable: Secondary | ICD-10-CM

## 2014-09-05 DIAGNOSIS — R200 Anesthesia of skin: Secondary | ICD-10-CM | POA: Insufficient documentation

## 2014-09-05 DIAGNOSIS — Y999 Unspecified external cause status: Secondary | ICD-10-CM | POA: Insufficient documentation

## 2014-09-05 DIAGNOSIS — Y939 Activity, unspecified: Secondary | ICD-10-CM | POA: Insufficient documentation

## 2014-09-05 DIAGNOSIS — Y9222 Religious institution as the place of occurrence of the external cause: Secondary | ICD-10-CM | POA: Insufficient documentation

## 2014-09-05 DIAGNOSIS — S0990XA Unspecified injury of head, initial encounter: Secondary | ICD-10-CM | POA: Insufficient documentation

## 2014-09-05 DIAGNOSIS — S199XXA Unspecified injury of neck, initial encounter: Secondary | ICD-10-CM | POA: Insufficient documentation

## 2014-09-05 LAB — I-STAT CHEM 8, ED
BUN: 16 mg/dL (ref 6–23)
Calcium, Ion: 1.18 mmol/L (ref 1.12–1.23)
Chloride: 106 mmol/L (ref 96–112)
Creatinine, Ser: 0.7 mg/dL (ref 0.50–1.10)
Glucose, Bld: 112 mg/dL — ABNORMAL HIGH (ref 70–99)
HCT: 35 % — ABNORMAL LOW (ref 36.0–46.0)
Hemoglobin: 11.9 g/dL — ABNORMAL LOW (ref 12.0–15.0)
Potassium: 3.8 mmol/L (ref 3.5–5.1)
Sodium: 141 mmol/L (ref 135–145)
TCO2: 19 mmol/L (ref 0–100)

## 2014-09-05 NOTE — ED Provider Notes (Signed)
CSN: [id number]     Arrival date & time 09/05/14  2237 History   First MD Initiated Contact with Patient 09/05/14 2238     Chief Complaint  Patient presents with  . Fall   HPI Patient presents to the emergency room for evaluation of trouble with headache and numbness. She was at church and had a stumble and fall this past weekend. Since that time she's been having some trouble with the persistent moderate diffuse headache. Today she also developed some numbness she felt on the left side of her body. Because of her persistent headache and the numbness she decided to come to the emergency room to be evaluated. She denies any trouble with her speech. She denies any trouble with her balance or coordination. She doesn't have any fevers or chills. No vomiting or diarrhea. History reviewed. No pertinent past medical history. Past Surgical History  Procedure Laterality Date  . Back surgery    . Tonsillectomy     History reviewed. No pertinent family history. History  Substance Use Topics  . Smoking status: Never Smoker   . Smokeless tobacco: Never Used  . Alcohol Use: No   OB History    No data available     Review of Systems  All other systems reviewed and are negative.     Allergies  Review of patient's allergies indicates no known allergies.  Home Medications   Prior to Admission medications   Medication Sig Start Date End Date Taking? Authorizing Provider  cyclobenzaprine (FLEXERIL) 10 MG tablet Take 1 tablet (10 mg total) by mouth 2 (two) times daily as needed for muscle spasms. 09/06/14   Dorie Rank, MD  naproxen (NAPROSYN) 500 MG tablet Take 1 tablet (500 mg total) by mouth 2 (two) times daily with a meal. As needed for pain 09/06/14   Dorie Rank, MD  traMADol (ULTRAM) 50 MG tablet Take 1 tablet (50 mg total) by mouth every 6 (six) hours as needed for pain. 05/17/12   Milton Ferguson, MD   BP 125/79 mmHg  Pulse 72  Temp(Src) 98.1 F (36.7 C) (Oral)  Resp 12  SpO2 100%  LMP   Physical Exam  Constitutional: She is oriented to person, place, and time. She appears well-developed and well-nourished. No distress.  HENT:  Head: Normocephalic and atraumatic.  Right Ear: External ear normal.  Left Ear: External ear normal.  Mouth/Throat: Oropharynx is clear and moist.  Eyes: Conjunctivae are normal. Right eye exhibits no discharge. Left eye exhibits no discharge. No scleral icterus.  Neck: Neck supple. No tracheal deviation present.  Cardiovascular: Normal rate, regular rhythm and intact distal pulses.   Pulmonary/Chest: Effort normal and breath sounds normal. No stridor. No respiratory distress. She has no wheezes. She has no rales.  Abdominal: Soft. Bowel sounds are normal. She exhibits no distension. There is no tenderness. There is no rebound and no guarding.  Musculoskeletal: She exhibits no edema.       Cervical back: She exhibits tenderness. She exhibits no swelling.       Thoracic back: Normal.       Lumbar back: Normal.  Neurological: She is alert and oriented to person, place, and time. She has normal strength. No cranial nerve deficit (no facial droop, extraocular movements intact, no slurred speech) or sensory deficit. She exhibits normal muscle tone. She displays no seizure activity. Coordination normal.  No pronator drift bilateral upper extrem, able to hold both legs off bed for 5 seconds, sensation intact in all extremities,  no visual field cuts, no left or right sided neglect, , no nystagmus noted   Skin: Skin is warm and dry. No rash noted.  Psychiatric: She has a normal mood and affect.  Nursing note and vitals reviewed.   ED Course  Procedures (including critical care time) Labs Review Labs Reviewed  I-STAT CHEM 8, ED - Abnormal; Notable for the following:    Glucose, Bld 112 (*)    Hemoglobin 11.9 (*)    HCT 35.0 (*)    All other components within normal limits    Imaging Review Ct Head Wo Contrast  09/06/2014   CLINICAL DATA:  Fall  backwards hitting head while at church. No loss of consciousness. Headache and neck pain, numbness on left side of face.  EXAM: CT HEAD WITHOUT CONTRAST  CT CERVICAL SPINE WITHOUT CONTRAST  TECHNIQUE: Multidetector CT imaging of the head and cervical spine was performed following the standard protocol without intravenous contrast. Multiplanar CT image reconstructions of the cervical spine were also generated.  COMPARISON:  Most recent head CT 04/1909  FINDINGS: CT HEAD FINDINGS  No intracranial hemorrhage, mass effect, or midline shift. No hydrocephalus. The basilar cisterns are patent. No evidence of territorial infarct. No intracranial fluid collection. Calvarium is intact. Included paranasal sinuses and mastoid air cells are well aerated.  CT CERVICAL SPINE FINDINGS  Anterior C5-C6 fusion with interbody spacer. The hardware is intact. There is associated degenerative disc disease at C4-C5 and C6-C7. Mild straightening of normal lordosis, no listhesis. No fracture. The dens is intact. No jumped or perched facets. No prevertebral soft tissue edema.  IMPRESSION: 1.  No acute intracranial abnormality. 2. Postsurgical change in the cervical spine without hardware complication or acute osseous abnormality.   Electronically Signed   By: Jeb Levering M.D.   On: 09/06/2014 00:02   Ct Cervical Spine Wo Contrast  09/06/2014   CLINICAL DATA:  Fall backwards hitting head while at church. No loss of consciousness. Headache and neck pain, numbness on left side of face.  EXAM: CT HEAD WITHOUT CONTRAST  CT CERVICAL SPINE WITHOUT CONTRAST  TECHNIQUE: Multidetector CT imaging of the head and cervical spine was performed following the standard protocol without intravenous contrast. Multiplanar CT image reconstructions of the cervical spine were also generated.  COMPARISON:  Most recent head CT 04/1909  FINDINGS: CT HEAD FINDINGS  No intracranial hemorrhage, mass effect, or midline shift. No hydrocephalus. The basilar cisterns  are patent. No evidence of territorial infarct. No intracranial fluid collection. Calvarium is intact. Included paranasal sinuses and mastoid air cells are well aerated.  CT CERVICAL SPINE FINDINGS  Anterior C5-C6 fusion with interbody spacer. The hardware is intact. There is associated degenerative disc disease at C4-C5 and C6-C7. Mild straightening of normal lordosis, no listhesis. No fracture. The dens is intact. No jumped or perched facets. No prevertebral soft tissue edema.  IMPRESSION: 1.  No acute intracranial abnormality. 2. Postsurgical change in the cervical spine without hardware complication or acute osseous abnormality.   Electronically Signed   By: Jeb Levering M.D.   On: 09/06/2014 00:02      MDM   Final diagnoses:  Acute post-traumatic headache, not intractable    Possibly mild concussion.  She does have some neck discomfort too.  Some of the numbness may be related to nerve impingement.  She has normal strength and sensation on my exam.  Will dc home with pain meds, muscle relaxant.   Dorie Rank, MD 09/06/14 [id number]

## 2014-09-05 NOTE — ED Notes (Signed)
Patient transported to CT 

## 2014-09-05 NOTE — ED Notes (Signed)
Pt. Was at church on Tuesday night and was being prayed on by the preacher. Pt. Fell backwards and hit her head. Pt c/o 7/10 headache. Double and blurred vision. Feeling more tired the past couple of days. Pt. Is A&O x4

## 2014-09-06 MED ORDER — NAPROXEN 500 MG PO TABS: 500.0000 mg | ORAL_TABLET | Freq: Two times a day (BID) | ORAL | Status: DC

## 2014-09-06 MED ORDER — CYCLOBENZAPRINE HCL 10 MG PO TABS: 10.0000 mg | ORAL_TABLET | Freq: Two times a day (BID) | ORAL | Status: DC | PRN

## 2014-09-06 NOTE — ED Notes (Signed)
Pt. Left with all belongings 

## 2014-09-06 NOTE — Discharge Instructions (Signed)

## 2014-12-26 ENCOUNTER — Other Ambulatory Visit: Payer: Self-pay | Admitting: Family Medicine

## 2014-12-26 DIAGNOSIS — IMO0002 Reserved for concepts with insufficient information to code with codable children: Secondary | ICD-10-CM

## 2014-12-26 DIAGNOSIS — R229 Localized swelling, mass and lump, unspecified: Principal | ICD-10-CM

## 2015-01-07 ENCOUNTER — Ambulatory Visit (HOSPITAL_COMMUNITY)
Admission: RE | Admit: 2015-01-07 | Discharge: 2015-01-07 | Disposition: A | Discharge status: Home / Self Care | Payer: BC Managed Care – PPO | Source: Ambulatory Visit | Attending: Family Medicine | Admitting: Family Medicine

## 2015-01-07 ENCOUNTER — Other Ambulatory Visit (HOSPITAL_COMMUNITY): Payer: Self-pay | Admitting: Family Medicine

## 2015-01-07 DIAGNOSIS — IMO0002 Reserved for concepts with insufficient information to code with codable children: Secondary | ICD-10-CM

## 2015-01-07 DIAGNOSIS — N6310 Unspecified lump in the right breast, unspecified quadrant: Secondary | ICD-10-CM

## 2015-01-07 DIAGNOSIS — N63 Unspecified lump in breast: Secondary | ICD-10-CM | POA: Insufficient documentation

## 2015-01-07 DIAGNOSIS — R229 Localized swelling, mass and lump, unspecified: Secondary | ICD-10-CM

## 2016-01-21 ENCOUNTER — Ambulatory Visit (HOSPITAL_COMMUNITY): Payer: BC Managed Care – PPO | Attending: Family Medicine | Admitting: Physical Therapy

## 2016-11-24 ENCOUNTER — Encounter (HOSPITAL_COMMUNITY): Payer: Self-pay | Admitting: Family Medicine

## 2016-11-24 ENCOUNTER — Ambulatory Visit (HOSPITAL_COMMUNITY)
Admission: EM | Admit: 2016-11-24 | Discharge: 2016-11-24 | Disposition: A | Discharge status: Home / Self Care | Payer: BC Managed Care – PPO | Attending: Family Medicine | Admitting: Family Medicine

## 2016-11-24 DIAGNOSIS — D649 Anemia, unspecified: Secondary | ICD-10-CM

## 2016-11-24 DIAGNOSIS — R600 Localized edema: Secondary | ICD-10-CM | POA: Diagnosis not present

## 2016-11-24 LAB — POCT I-STAT, CHEM 8
BUN: 11 mg/dL (ref 6–20)
Calcium, Ion: 1.18 mmol/L (ref 1.15–1.40)
Chloride: 106 mmol/L (ref 101–111)
Creatinine, Ser: 0.7 mg/dL (ref 0.44–1.00)
Glucose, Bld: 125 mg/dL — ABNORMAL HIGH (ref 65–99)
HCT: 32 % — ABNORMAL LOW (ref 36.0–46.0)
Hemoglobin: 10.9 g/dL — ABNORMAL LOW (ref 12.0–15.0)
Potassium: 3.6 mmol/L (ref 3.5–5.1)
Sodium: 140 mmol/L (ref 135–145)
TCO2: 24 mmol/L (ref 0–100)

## 2016-11-24 LAB — POCT URINALYSIS DIP (DEVICE)
Glucose, UA: NEGATIVE mg/dL
Leukocytes, UA: NEGATIVE
Nitrite: NEGATIVE
Protein, ur: 30 mg/dL — AB
Specific Gravity, Urine: 1.02 (ref 1.005–1.030)
Urobilinogen, UA: 1 mg/dL (ref 0.0–1.0)
pH: 8.5 — ABNORMAL HIGH (ref 5.0–8.0)

## 2016-11-24 NOTE — ED Provider Notes (Signed)
[place]    CSN: [id number] Arrival date & time: 11/24/16  1729     History   Chief Complaint Chief Complaint  Patient presents with  . Joint Swelling    HPI [name] is a 51 y.o. female.   51 year old woman who comes into the [facility] urgent care center complaining of ankle swelling. This began about 2 weeks ago and both legs, worse on the left, and is associated with some sharp pains intermittently. The swelling does not go down at night. She has had some mild calf tenderness and fatigue. She's had no chest pain or shortness of breath.  Patient's had no change in her activity. 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 She's been doing this kind of work for years and this is a first-time she's had this kind of swelling. She's had no new medications and has not done any long trips.      History reviewed. No pertinent past medical history.  There are no active problems to display for this patient.   Past Surgical History:  Procedure Laterality Date  . BACK SURGERY    . TONSILLECTOMY      OB History    No data available       Home Medications    Prior to Admission medications   Medication Sig Start Date End Date Taking? Authorizing Provider  cyclobenzaprine (FLEXERIL) 10 MG tablet Take 1 tablet (10 mg total) by mouth 2 (two) times daily as needed for muscle spasms. 09/06/14   Dorie Rank, MD  naproxen (NAPROSYN) 500 MG tablet Take 1 tablet (500 mg total) by mouth 2 (two) times daily with a meal. As needed for pain 09/06/14   Dorie Rank, MD  traMADol (ULTRAM) 50 MG tablet Take 1 tablet (50 mg total) by mouth every 6 (six) hours as needed for pain. 05/17/12   Milton Ferguson, MD    Family History No family history on file.  Social History Social History  Substance Use Topics  . Smoking status: Never Smoker  . Smokeless tobacco: Never Used  . Alcohol use No     Allergies   Patient has no known  allergies.   Review of Systems Review of Systems  Constitutional: Positive for fatigue. Negative for activity change and fever.  HENT: Negative.   Respiratory: Negative.   Cardiovascular: Negative.   Gastrointestinal: Negative.   Neurological: Negative.      Physical Exam Triage Vital Signs ED Triage Vitals  Enc Vitals Group     BP      Pulse      Resp      Temp      Temp src      SpO2      Weight      Height      Head Circumference      Peak Flow      Pain Score      Pain Loc      Pain Edu?      Excl. in [place]?    No data found.   Updated Vital Signs BP 119/76 (BP Location: Right Arm)   Pulse 84   Temp 99 F (37.2 C) (Oral)   Resp 16   Ht 5' 5.5" (1.664 m)   Wt 202 lb (91.6 kg)   LMP 11/03/2016   SpO2 97%   BMI 33.10 kg/m    Physical Exam  Constitutional: She is oriented  to person, place, and time. She appears well-developed and well-nourished.  HENT:  Right Ear: External ear normal.  Left Ear: External ear normal.  Eyes: Conjunctivae and EOM are normal. Pupils are equal, round, and reactive to light.  Neck: Normal range of motion. Neck supple.  Cardiovascular: Normal rate, regular rhythm and normal heart sounds.   Pulmonary/Chest: Effort normal and breath sounds normal.  Musculoskeletal: Normal range of motion. She exhibits edema.  Patient differently has significant edema and skin tightness in both lower extremities from her ankles to her knees. She has some tenderness with deep palpation of the left calf and anterior shin.  Neurological: She is alert and oriented to person, place, and time.  Skin: Skin is warm and dry.  Nursing note and vitals reviewed.    UC Treatments / Results  Labs (all labs ordered are listed, but only abnormal results are displayed) Labs Reviewed  POCT URINALYSIS DIP (DEVICE) - Abnormal; Notable for the following:       Result Value   Bilirubin Urine SMALL (*)    Ketones, ur TRACE (*)    Hgb urine dipstick LARGE (*)      pH 8.5 (*)    Protein, ur 30 (*)    All other components within normal limits  POCT I-STAT, CHEM 8 - Abnormal; Notable for the following:    Glucose, Bld 125 (*)    Hemoglobin 10.9 (*)    HCT 32.0 (*)    All other components within normal limits    EKG  EKG Interpretation None       Radiology No results found.  Procedures Procedures (including critical care time)  Medications Ordered in UC Medications - No data to display   Initial Impression / Assessment and Plan / UC Course  I have reviewed the triage vital signs and the nursing notes.  Pertinent labs & imaging results that were available during my care of the patient were reviewed by me and considered in my medical decision making (see chart for details).     Final Clinical Impressions(s) / UC Diagnoses   Final diagnoses:  Lower extremity edema  Anemia, unspecified type    New Prescriptions New Prescriptions   No medications on file     Robyn Haber, MD 11/24/16 1820

## 2016-11-24 NOTE — Discharge Instructions (Signed)
Call Dr. Criss Rosales tomorrow morning and request that she order a venous Doppler ultrasound of your legs to rule out clots.  Explained to Dr. Fransico Setters office that you were seen at the urgent care center and both urine and blood tests were done showing only mild anemia and blood in the urine attributed to your current menstrual cycle.

## 2016-11-24 NOTE — ED Triage Notes (Signed)
Pt. Stated, I have both my ankles to start swelling 2 weeks ago. No injuries

## 2017-02-23 ENCOUNTER — Other Ambulatory Visit (HOSPITAL_COMMUNITY): Payer: Self-pay | Admitting: Internal Medicine

## 2017-02-23 DIAGNOSIS — Z1231 Encounter for screening mammogram for malignant neoplasm of breast: Secondary | ICD-10-CM

## 2017-02-24 ENCOUNTER — Encounter (INDEPENDENT_AMBULATORY_CARE_PROVIDER_SITE_OTHER): Payer: Self-pay

## 2017-02-24 ENCOUNTER — Encounter: Payer: Self-pay | Admitting: Advanced Practice Midwife

## 2017-02-24 ENCOUNTER — Ambulatory Visit (INDEPENDENT_AMBULATORY_CARE_PROVIDER_SITE_OTHER): Payer: BC Managed Care – PPO | Admitting: Advanced Practice Midwife

## 2017-02-24 VITALS — BP 120/70 | HR 70 | Ht 65.0 in | Wt 204.5 lb

## 2017-02-24 DIAGNOSIS — D649 Anemia, unspecified: Secondary | ICD-10-CM

## 2017-02-24 DIAGNOSIS — N921 Excessive and frequent menstruation with irregular cycle: Secondary | ICD-10-CM

## 2017-02-24 LAB — POCT HEMOGLOBIN: Hemoglobin: 9.8 g/dL — AB (ref 12.2–16.2)

## 2017-02-24 NOTE — Patient Instructions (Addendum)
Nuva Ring Endometrial Ablations Mirnea IUD  Pycnogenol 100mg  day for hot flashes     We will talk about when to get your pap smear when you decide what you want to do.

## 2017-02-24 NOTE — Progress Notes (Signed)
[facility] Visit  Patient name: [name] MRN [id number]  Date of birth: 06/08/1965  CC & HPI:  [name] is a 51 y.o. African American female presenting today for referral from PCP for anemia, possible 2/2 menorrhagia.  Pt's menstrual cycle is gotten heavier and clotty each m onth starting at the beginning of this year. Has skipped some periods, as well. LMP 8/26, very heavy: tampons and 2 pads. Clots.  Has also been having night sweats/hot flashes off and on for the past 3-4 months.  Hasn't had pap in 5 years or so, didn;t get one w/PCP, so will need PAP ONLY (had physical).  At some point.  Discussed options:  Medical/surgical .  Pt to choose bt Nuva Ring (eventually continuously) Mirena, or endo abllation  Pertinent History Reviewed:  Medical & Surgical Hx:   Past Medical History:  Diagnosis Date  . Anemia    Past Surgical History:  Procedure Laterality Date  . BACK SURGERY    . TONSILLECTOMY     Family History  Problem Relation Age of Onset  . Diabetes Maternal Grandmother   . Other Maternal Grandfather        brain tumor  . Diabetes Father   . Hypertension Father   . Diabetes Mother   . Diabetes Sister   . Diabetes Sister   . Asthma Daughter     Current Outpatient Prescriptions:  .  FERROUS SULFATE PO, Take by mouth 2 (two) times daily., Disp: , Rfl:  .  Multiple Vitamin (MULTIVITAMIN) tablet, Take 1 tablet by mouth. Takes once or twice a week, Disp: , Rfl:  Social History: Reviewed -  reports that she has never smoked. She has never used smokeless tobacco.  Review of Systems:    Constitutional: Negative for fever and chills Eyes: Negative for visual disturbances Respiratory: Negative for shortness of breath, dyspnea Cardiovascular: Negative for chest pain or palpitations  Gastrointestinal: Negative for vomiting, diarrhea and constipation; no abdominal pain Genitourinary: Negative for dysuria and urgency, vaginal irritation or  itching Musculoskeletal: Negative for back pain, joint pain, myalgias  Neurological: Negative for dizziness and headaches    Objective Findings:  Vitals: BP 120/70 (BP Location: Left Arm, Patient Position: Sitting, Cuff Size: Large)   Pulse 70   Ht 5\' 5"  (1.651 m)   Wt 204 lb 8 oz (92.8 kg)   LMP 01/30/2017   BMI 34.03 kg/m   Physical Examination: General appearance - well appearing, and in no distress Mental status - alert, oriented to person, place, and time Chest:  Normal respiratory effort Heart - normal rate and regular rhythm Abdomen:  Soft, nontender Pelvic: deferred Musculoskeletal:  Normal range of motion without pain Extremities:  No edema  Results for orders placed or performed in visit on 02/24/17 (from the past 24 hour(s))  POCT hemoglobin   Collection Time: 02/24/17  2:55 PM  Result Value Ref Range   Hemoglobin 9.8 (A) 12.2 - 16.2 g/dL          Assessment & Plan:  A:   Menorrhagia w/anemia P:  Let me know by next week.    No Follow-up on file.   CRESENZO-DISHMAN,Afrah Burlison CNM 02/24/2017 3:02 PM

## 2017-03-02 ENCOUNTER — Ambulatory Visit (HOSPITAL_COMMUNITY)
Admission: RE | Admit: 2017-03-02 | Discharge: 2017-03-02 | Disposition: A | Discharge status: Home / Self Care | Payer: BC Managed Care – PPO | Source: Ambulatory Visit | Attending: Internal Medicine | Admitting: Internal Medicine

## 2017-03-02 DIAGNOSIS — Z1231 Encounter for screening mammogram for malignant neoplasm of breast: Secondary | ICD-10-CM | POA: Diagnosis present

## 2017-09-21 ENCOUNTER — Emergency Department (HOSPITAL_COMMUNITY)
Admission: EM | Admit: 2017-09-21 | Discharge: 2017-09-21 | Disposition: A | Discharge status: Home / Self Care | Payer: BC Managed Care – PPO | Attending: Emergency Medicine | Admitting: Emergency Medicine

## 2017-09-21 ENCOUNTER — Other Ambulatory Visit: Payer: Self-pay

## 2017-09-21 ENCOUNTER — Emergency Department (HOSPITAL_COMMUNITY): Payer: BC Managed Care – PPO

## 2017-09-21 ENCOUNTER — Encounter (HOSPITAL_COMMUNITY): Payer: Self-pay | Admitting: Emergency Medicine

## 2017-09-21 DIAGNOSIS — H5712 Ocular pain, left eye: Secondary | ICD-10-CM | POA: Insufficient documentation

## 2017-09-21 DIAGNOSIS — R51 Headache: Secondary | ICD-10-CM | POA: Insufficient documentation

## 2017-09-21 LAB — CBC WITH DIFFERENTIAL/PLATELET
Basophils Absolute: 0 10*3/uL (ref 0.0–0.1)
Basophils Relative: 0 %
Eosinophils Absolute: 0.1 10*3/uL (ref 0.0–0.7)
Eosinophils Relative: 2 %
HCT: 33.1 % — ABNORMAL LOW (ref 36.0–46.0)
Hemoglobin: 9.8 g/dL — ABNORMAL LOW (ref 12.0–15.0)
Lymphocytes Relative: 29 %
Lymphs Abs: 1.4 10*3/uL (ref 0.7–4.0)
MCH: 22.9 pg — ABNORMAL LOW (ref 26.0–34.0)
MCHC: 29.6 g/dL — ABNORMAL LOW (ref 30.0–36.0)
MCV: 77.3 fL — ABNORMAL LOW (ref 78.0–100.0)
Monocytes Absolute: 0.4 10*3/uL (ref 0.1–1.0)
Monocytes Relative: 9 %
Neutro Abs: 2.8 10*3/uL (ref 1.7–7.7)
Neutrophils Relative %: 60 %
Platelets: 375 10*3/uL (ref 150–400)
RBC: 4.28 MIL/uL (ref 3.87–5.11)
RDW: 17.3 % — ABNORMAL HIGH (ref 11.5–15.5)
WBC: 4.7 10*3/uL (ref 4.0–10.5)

## 2017-09-21 LAB — BASIC METABOLIC PANEL
Anion gap: 9 (ref 5–15)
BUN: 10 mg/dL (ref 6–20)
CO2: 23 mmol/L (ref 22–32)
Calcium: 8.6 mg/dL — ABNORMAL LOW (ref 8.9–10.3)
Chloride: 106 mmol/L (ref 101–111)
Creatinine, Ser: 0.63 mg/dL (ref 0.44–1.00)
GFR calc Af Amer: >60 mL/min (ref >60)
GFR calc non Af Amer: >60 mL/min (ref >60)
Glucose, Bld: 89 mg/dL (ref 65–99)
Potassium: 3.7 mmol/L (ref 3.5–5.1)
Sodium: 138 mmol/L (ref 135–145)

## 2017-09-21 LAB — SEDIMENTATION RATE: Sed Rate: 26 mm/hr — ABNORMAL HIGH (ref 0–22)

## 2017-09-21 MED ORDER — TETRACAINE HCL 0.5 % OP SOLN
2.0000 [drp] | Freq: Once | OPHTHALMIC | Status: AC
  Administered 2017-09-21: 2 [drp] via OPHTHALMIC
  Filled 2017-09-21: qty 4

## 2017-09-21 MED ORDER — DIPHENHYDRAMINE HCL 50 MG/ML IJ SOLN
25.0000 mg | Freq: Once | INTRAMUSCULAR | Status: AC
  Administered 2017-09-21: 25 mg via INTRAVENOUS
  Filled 2017-09-21: qty 1

## 2017-09-21 MED ORDER — FLUORESCEIN SODIUM 1 MG OP STRP
ORAL_STRIP | OPHTHALMIC | Status: AC
  Administered 2017-09-21: 1 via OPHTHALMIC
  Filled 2017-09-21: qty 1

## 2017-09-21 MED ORDER — FLUORESCEIN SODIUM 1 MG OP STRP
1.0000 | ORAL_STRIP | Freq: Once | OPHTHALMIC | Status: AC
  Administered 2017-09-21: 1 via OPHTHALMIC

## 2017-09-21 MED ORDER — METOCLOPRAMIDE HCL 5 MG/ML IJ SOLN
10.0000 mg | Freq: Once | INTRAMUSCULAR | Status: AC
  Administered 2017-09-21: 10 mg via INTRAVENOUS
  Filled 2017-09-21: qty 2

## 2017-09-21 NOTE — ED Provider Notes (Signed)
[facility] EMERGENCY DEPARTMENT Provider Note   CSN: [id number] Arrival date & time: 09/21/17  5784     History   Chief Complaint Chief Complaint  Patient presents with  . Headache    HPI Aviendha B Hemric is a 52 y.o. female.  HPI   Ramina B Pizzuto is a 52 y.o. female who presents to the Emergency Department complaining of left-sided headache of gradual onset for 3 days.  Headache is associated with dizziness upon moving and light sensitivity.  She describes pain to her left eye as piercing and radiating into her temple and top of her head.  Eye pain has been associated with redness to her eye and excessive tearing.  She has not taken any medication for symptom relief.  She states she wears corrective lenses but not contacts.  She denies visual changes or vision loss, trauma to her face or eye.  She also denies any nausea or vomiting, neck pain or stiffness, fever, chills, or possible foreign body of the eye. Nothing makes the eye pain better or worse.      Past Medical History:  Diagnosis Date  . Anemia     There are no active problems to display for this patient.   Past Surgical History:  Procedure Laterality Date  . BACK SURGERY    . TONSILLECTOMY       OB History    Gravida  3   Para  2   Term  2   Preterm      AB  1   Living  2     SAB  1   TAB      Ectopic      Multiple      Live Births  2            Home Medications    Prior to Admission medications   Medication Sig Start Date End Date Taking? Authorizing Provider  FERROUS SULFATE PO Take by mouth 2 (two) times daily.    [provider]  Multiple Vitamin (MULTIVITAMIN) tablet Take 1 tablet by mouth. Takes once or twice a week    [provider]    Family History Family History  Problem Relation Age of Onset  . Diabetes Maternal Grandmother   . Other Maternal Grandfather        brain tumor  . Diabetes Father   . Hypertension Father   . Diabetes Mother   .  Diabetes Sister   . Diabetes Sister   . Asthma Daughter     Social History Social History   Tobacco Use  . Smoking status: Never Smoker  . Smokeless tobacco: Never Used  Substance Use Topics  . Alcohol use: No  . Drug use: No     Allergies   Patient has no known allergies.   Review of Systems Review of Systems  Constitutional: Negative for activity change, appetite change and fever.  HENT: Negative for facial swelling, sore throat and trouble swallowing.   Eyes: Positive for photophobia, pain and redness. Negative for visual disturbance.       Excessive tearing  Respiratory: Negative for shortness of breath.   Cardiovascular: Negative for chest pain.  Gastrointestinal: Negative for abdominal pain, nausea and vomiting.  Musculoskeletal: Negative for neck pain and neck stiffness.  Skin: Negative for rash and wound.  Neurological: Positive for headaches. Negative for dizziness, syncope, facial asymmetry, speech difficulty, weakness and numbness.  Psychiatric/Behavioral: Negative for confusion and decreased concentration.  All other systems  reviewed and are negative.    Physical Exam Updated Vital Signs BP 118/64   Pulse 63   Temp 97.8 F (36.6 C) (Oral)   Resp 18   Ht 5\' 5"  (1.651 m)   Wt 86.2 kg (190 lb)   SpO2 100%   BMI 31.62 kg/m   Physical Exam  Constitutional: She is oriented to person, place, and time.  Non-toxic appearance.  Uncomfortable appearing  HENT:  Head: Atraumatic.  Mouth/Throat: Oropharynx is clear and moist.  Eyes: Pupils are equal, round, and reactive to light. EOM are normal. Lids are everted and swept, no foreign bodies found. Left eye exhibits no chemosis and no exudate. No foreign body present in the left eye. Left conjunctiva is injected. Left conjunctiva has no hemorrhage.  Fundoscopic exam:      The left eye shows no papilledema.  Slit lamp exam:      The left eye shows no corneal flare, no corneal ulcer, no foreign body, no hyphema  and no fluorescein uptake.  Neck: Normal range of motion. Neck supple. No Kernig's sign noted.  Cardiovascular: Normal rate, regular rhythm and normal heart sounds.  Pulmonary/Chest: Effort normal and breath sounds normal.  Musculoskeletal: Normal range of motion.  Neurological: She is alert and oriented to person, place, and time. She has normal strength. No sensory deficit. GCS eye subscore is 4. GCS verbal subscore is 5. GCS motor subscore is 6.  CN II-XII intact.  Speech clear, no pronator drift or facial weakness.  No extremity weakness   Skin: Skin is warm. No rash noted.  Psychiatric: Her mood appears anxious.  Nursing note and vitals reviewed.    ED Treatments / Results  Labs (all labs ordered are listed, but only abnormal results are displayed) Labs Reviewed  BASIC METABOLIC PANEL - Abnormal; Notable for the following components:      Result Value   Calcium 8.6 (*)    All other components within normal limits  CBC WITH DIFFERENTIAL/PLATELET - Abnormal; Notable for the following components:   Hemoglobin 9.8 (*)    HCT 33.1 (*)    MCV 77.3 (*)    MCH 22.9 (*)    MCHC 29.6 (*)    RDW 17.3 (*)    All other components within normal limits  SEDIMENTATION RATE - Abnormal; Notable for the following components:   Sed Rate 26 (*)    All other components within normal limits    EKG None  Radiology Ct Head Wo Contrast  Result Date: 09/21/2017 CLINICAL DATA:  Left side headache for 3 days. EXAM: CT HEAD WITHOUT CONTRAST TECHNIQUE: Contiguous axial images were obtained from the base of the skull through the vertex without intravenous contrast. COMPARISON:  09/05/2014 FINDINGS: Brain: No acute intracranial abnormality. Specifically, no hemorrhage, hydrocephalus, mass lesion, acute infarction, or significant intracranial injury. Vascular: No hyperdense vessel or unexpected calcification. Skull: No acute calvarial abnormality. Sinuses/Orbits: Visualized paranasal sinuses and mastoids  clear. Orbital soft tissues unremarkable. Other: None IMPRESSION: Normal study. Electronically Signed   By: Rolm Baptise M.D.   On: 09/21/2017 11:37    Procedures Procedures (including critical care time)  Medications Ordered in ED Medications  diphenhydrAMINE (BENADRYL) injection 25 mg (25 mg Intravenous Given 09/21/17 1025)  metoCLOPramide (REGLAN) injection 10 mg (10 mg Intravenous Given 09/21/17 1025)  tetracaine (PONTOCAINE) 0.5 % ophthalmic solution 2 drop (2 drops Right Eye Given by Other 09/21/17 1143)  fluorescein ophthalmic strip 1 strip (1 strip Left Eye Given by Other 09/21/17 1158)  Initial Impression / Assessment and Plan / ED Course  I have reviewed the triage vital signs and the nursing notes.  Pertinent labs & imaging results that were available during my care of the patient were reviewed by me and considered in my medical decision making (see chart for details).     IOP of left eye measured with tonopen.  avg reading of 13 mmHg  Pt with headache and left eye pain and redness. No focal neuro deficits, no visual deficits. Possible uveitis.  Pt also seen by Dr. Roderic Palau and care plan discussed  1325  Consulted Dr. Noel Journey, ophthmalogy, will see pt in his office upon d/c from ER  Final Clinical Impressions(s) / ED Diagnoses   Final diagnoses:  Left eye pain    ED Discharge Orders    None       Bufford Lope 09/21/17 2045    Milton Ferguson, MD 09/22/17 [phone]

## 2017-09-21 NOTE — Discharge Instructions (Addendum)
As discussed, go directly to Dr. Kris Mouton office.  He will see you in his office

## 2017-09-21 NOTE — ED Triage Notes (Signed)
Pt c/o of left sided headache x 3 days with dizziness and light sensitivity to eyes.

## 2017-09-21 NOTE — ED Notes (Signed)
ED Provider at bedside. 

## 2017-10-05 ENCOUNTER — Encounter (HOSPITAL_COMMUNITY): Payer: Self-pay | Admitting: Emergency Medicine

## 2017-10-05 ENCOUNTER — Ambulatory Visit (INDEPENDENT_AMBULATORY_CARE_PROVIDER_SITE_OTHER): Payer: BC Managed Care – PPO

## 2017-10-05 ENCOUNTER — Ambulatory Visit (HOSPITAL_COMMUNITY)
Admission: EM | Admit: 2017-10-05 | Discharge: 2017-10-05 | Disposition: A | Discharge status: Home / Self Care | Payer: BC Managed Care – PPO | Attending: Emergency Medicine | Admitting: Emergency Medicine

## 2017-10-05 DIAGNOSIS — R059 Cough, unspecified: Secondary | ICD-10-CM

## 2017-10-05 DIAGNOSIS — R05 Cough: Secondary | ICD-10-CM

## 2017-10-05 MED ORDER — BENZONATATE 200 MG PO CAPS: 200.0000 mg | ORAL_CAPSULE | Freq: Three times a day (TID) | ORAL | 0 refills | Status: DC

## 2017-10-05 NOTE — ED Provider Notes (Signed)
[facility]    CSN: [id number] Arrival date & time: 10/05/17  1331     History   Chief Complaint Chief Complaint  Patient presents with  . Cough    HPI [name] is a 52 y.o. female no contributing past medical history presenting today for evaluation of a cough.  Patient states that she has had a cough for approximately 2 months.  Cough is been dry.  Not associated with other URI symptoms including sore throat and congestion.  Patient also relates a long history of evaluation of left eye pain.  She saw her PCP 2 days ago who is requesting her to have a chest x-ray done to [facility] for evaluation of possible sarcoidosis causing all of her symptoms.  [facility] did not have the equipment, prompting her coming here for the chest x-ray.  She denies any fevers.  She has not tried any medicines for her cough.  Denies any chest pain.  Denies any lower extremity swelling, occasionally has had some left ankle swelling, but denies this at this time.  Denies previous DVT/PE.  Denies recent travel/immobilization.  Denies being on oral contraceptives or any form of birth control.  Denies history of cancer.  Patient is seeing an eye specialist about her eye pain, pain today is 1 out of 10.  HPI  Past Medical History:  Diagnosis Date  . Anemia     There are no active problems to display for this patient.   Past Surgical History:  Procedure Laterality Date  . BACK SURGERY    . TONSILLECTOMY      OB History    Gravida  3   Para  2   Term  2   Preterm      AB  1   Living  2     SAB  1   TAB      Ectopic      Multiple      Live Births  2            Home Medications    Prior to Admission medications   Medication Sig Start Date End Date Taking? Authorizing Provider  benzonatate (TESSALON) 200 MG capsule Take 1 capsule (200 mg total) by mouth every 8 (eight) hours. 10/05/17   Adiyah Lame C, PA-C  FERROUS SULFATE PO Take by mouth 2 (two) times daily.     [provider]  Multiple Vitamin (MULTIVITAMIN) tablet Take 1 tablet by mouth. Takes once or twice a week    [provider]    Family History Family History  Problem Relation Age of Onset  . Diabetes Maternal Grandmother   . Other Maternal Grandfather        brain tumor  . Diabetes Father   . Hypertension Father   . Diabetes Mother   . Diabetes Sister   . Diabetes Sister   . Asthma Daughter     Social History Social History   Tobacco Use  . Smoking status: Never Smoker  . Smokeless tobacco: Never Used  Substance Use Topics  . Alcohol use: No  . Drug use: No     Allergies   Patient has no known allergies.   Review of Systems Review of Systems  Constitutional: Negative for activity change, appetite change, fatigue and fever.  Respiratory: Positive for cough and shortness of breath.   Cardiovascular: Negative for chest pain and leg swelling.  Gastrointestinal: Negative for abdominal pain, nausea and vomiting.  Musculoskeletal: Negative for  myalgias.  Neurological: Negative for dizziness, syncope, weakness and light-headedness.     Physical Exam Triage Vital Signs ED Triage Vitals [10/05/17 1421]  Enc Vitals Group     BP 127/61     Pulse Rate 70     Resp 18     Temp 98.2 F (36.8 C)     Temp Source Oral     SpO2 99 %     Weight      Height      Head Circumference      Peak Flow      Pain Score      Pain Loc      Pain Edu?      Excl. in [place]?    No data found.  Updated Vital Signs BP 127/61 (BP Location: Right Arm)   Pulse 70   Temp 98.2 F (36.8 C) (Oral)   Resp 18   LMP 07/19/2017 Comment: denies pregnancy  SpO2 99%   Visual Acuity Right Eye Distance:   Left Eye Distance:   Bilateral Distance:    Right Eye Near:   Left Eye Near:    Bilateral Near:     Physical Exam  Constitutional: She appears well-developed and well-nourished. No distress.  HENT:  Head: Normocephalic and atraumatic.  Mouth/Throat: Oropharynx is  clear and moist.  Eyes: Conjunctivae are normal.  Neck: Neck supple.  Cardiovascular: Normal rate and regular rhythm.  No murmur heard. Pulmonary/Chest: Effort normal and breath sounds normal. No respiratory distress.  Breathing comfortably at rest, CTA BL; chest nontender to palpation  Abdominal: Soft. There is no tenderness.  Musculoskeletal: She exhibits no edema.  Neurological: She is alert.  Skin: Skin is warm and dry.  Psychiatric: She has a normal mood and affect.  Nursing note and vitals reviewed.    UC Treatments / Results  Labs (all labs ordered are listed, but only abnormal results are displayed) Labs Reviewed - No data to display  EKG None  Radiology Dg Chest 2 View  Result Date: 10/05/2017 CLINICAL DATA:  52 year old female with a history of cough and shortness of breath EXAM: CHEST - 2 VIEW COMPARISON:  05/17/2012 FINDINGS: Cardiomediastinal silhouette within normal limits. No evidence of central vascular congestion. No pneumothorax or pleural effusion. No confluent airspace disease. Surgical changes of the cervical region, incompletely imaged. No displaced fracture IMPRESSION: Negative for acute cardiopulmonary disease Electronically Signed   By: Corrie Mckusick D.O.   On: 10/05/2017 15:05    Procedures Procedures (including critical care time)  Medications Ordered in UC Medications - No data to display  Initial Impression / Assessment and Plan / UC Course  I have reviewed the triage vital signs and the nursing notes.  Pertinent labs & imaging results that were available during my care of the patient were reviewed by me and considered in my medical decision making (see chart for details).     Chest x-ray negative for intrapulmonary disease.  Will provide patient with Tessalon to help with cough.  Follow-up with PCP for further evaluation of cough as well as eye pain. Discussed strict return precautions. Patient verbalized understanding and is agreeable with  plan.  Final Clinical Impressions(s) / UC Diagnoses   Final diagnoses:  Cough     Discharge Instructions     Chest xray normal. Please follow up with your primary doctor.  You may try tessalon to help with your cough.    ED Prescriptions    Medication Sig Dispense Auth. Provider  benzonatate (TESSALON) 200 MG capsule Take 1 capsule (200 mg total) by mouth every 8 (eight) hours. 30 capsule Deanie Jupiter C, PA-C     Controlled Substance Prescriptions [place] Controlled Substance Registry consulted? Not Applicable   Janith Lima, [place] 10/05/17 7106

## 2017-10-05 NOTE — Discharge Instructions (Addendum)
Chest xray normal. Please follow up with your primary doctor.  You may try tessalon to help with your cough.

## 2017-10-05 NOTE — ED Triage Notes (Signed)
Pt sts was advised to have chest xray by her PCP so pt came here to have done for coughing

## 2018-02-23 ENCOUNTER — Telehealth: Payer: Self-pay

## 2018-02-23 ENCOUNTER — Ambulatory Visit: Payer: BC Managed Care – PPO

## 2018-02-23 NOTE — Telephone Encounter (Signed)
noted 

## 2018-02-23 NOTE — Telephone Encounter (Signed)
PATIENT WAS A NO SHOW AND LETTER SENT  °

## 2018-03-06 ENCOUNTER — Ambulatory Visit: Payer: BC Managed Care – PPO

## 2018-08-18 ENCOUNTER — Encounter (HOSPITAL_COMMUNITY): Payer: Self-pay | Admitting: Emergency Medicine

## 2018-08-18 ENCOUNTER — Emergency Department (HOSPITAL_COMMUNITY): Payer: BC Managed Care – PPO

## 2018-08-18 ENCOUNTER — Other Ambulatory Visit: Payer: Self-pay

## 2018-08-18 ENCOUNTER — Emergency Department (HOSPITAL_COMMUNITY)
Admission: EM | Admit: 2018-08-18 | Discharge: 2018-08-18 | Disposition: A | Discharge status: Home / Self Care | Payer: BC Managed Care – PPO | Attending: Emergency Medicine | Admitting: Emergency Medicine

## 2018-08-18 DIAGNOSIS — Z79899 Other long term (current) drug therapy: Secondary | ICD-10-CM | POA: Insufficient documentation

## 2018-08-18 DIAGNOSIS — R1031 Right lower quadrant pain: Secondary | ICD-10-CM

## 2018-08-18 LAB — URINALYSIS, ROUTINE W REFLEX MICROSCOPIC
Bilirubin Urine: NEGATIVE
Glucose, UA: NEGATIVE mg/dL
Ketones, ur: NEGATIVE mg/dL
Nitrite: NEGATIVE
Protein, ur: NEGATIVE mg/dL
Specific Gravity, Urine: 1.019 (ref 1.005–1.030)
pH: 6 (ref 5.0–8.0)

## 2018-08-18 LAB — COMPREHENSIVE METABOLIC PANEL
ALT: 15 U/L (ref 0–44)
AST: 19 U/L (ref 15–41)
Albumin: 3.8 g/dL (ref 3.5–5.0)
Alkaline Phosphatase: 67 U/L (ref 38–126)
Anion gap: 6 (ref 5–15)
BUN: 13 mg/dL (ref 6–20)
CO2: 24 mmol/L (ref 22–32)
Calcium: 8.8 mg/dL — ABNORMAL LOW (ref 8.9–10.3)
Chloride: 110 mmol/L (ref 98–111)
Creatinine, Ser: 0.79 mg/dL (ref 0.44–1.00)
GFR calc Af Amer: >60 mL/min (ref >60)
GFR calc non Af Amer: >60 mL/min (ref >60)
Glucose, Bld: 91 mg/dL (ref 70–99)
Potassium: 3.9 mmol/L (ref 3.5–5.1)
Sodium: 140 mmol/L (ref 135–145)
Total Bilirubin: 0.4 mg/dL (ref 0.3–1.2)
Total Protein: 7.5 g/dL (ref 6.5–8.1)

## 2018-08-18 LAB — I-STAT BETA HCG BLOOD, ED (MC, WL, AP ONLY): I-stat hCG, quantitative: <5 m[IU]/mL (ref <5)

## 2018-08-18 LAB — CBC
HCT: 35.6 % — ABNORMAL LOW (ref 36.0–46.0)
Hemoglobin: 10.9 g/dL — ABNORMAL LOW (ref 12.0–15.0)
MCH: 24.9 pg — ABNORMAL LOW (ref 26.0–34.0)
MCHC: 30.6 g/dL (ref 30.0–36.0)
MCV: 81.3 fL (ref 80.0–100.0)
Platelets: 386 10*3/uL (ref 150–400)
RBC: 4.38 MIL/uL (ref 3.87–5.11)
RDW: 17.6 % — ABNORMAL HIGH (ref 11.5–15.5)
WBC: 6.2 10*3/uL (ref 4.0–10.5)
nRBC: 0 % (ref 0.0–0.2)

## 2018-08-18 LAB — DIFFERENTIAL
Basophils Absolute: 0 10*3/uL (ref 0.0–0.1)
Basophils Relative: 1 %
Eosinophils Absolute: 0.1 10*3/uL (ref 0.0–0.5)
Eosinophils Relative: 1 %
Lymphocytes Relative: 22 %
Lymphs Abs: 1.4 10*3/uL (ref 0.7–4.0)
Monocytes Absolute: 0.6 10*3/uL (ref 0.1–1.0)
Monocytes Relative: 9 %
Neutro Abs: 4.2 10*3/uL (ref 1.7–7.7)
Neutrophils Relative %: 67 %

## 2018-08-18 LAB — LIPASE, BLOOD: Lipase: 26 U/L (ref 11–51)

## 2018-08-18 MED ORDER — ONDANSETRON HCL 4 MG/2ML IJ SOLN
4.0000 mg | Freq: Once | INTRAMUSCULAR | Status: AC
  Administered 2018-08-18: 4 mg via INTRAVENOUS
  Filled 2018-08-18: qty 2

## 2018-08-18 MED ORDER — HYDROMORPHONE HCL 1 MG/ML IJ SOLN
1.0000 mg | Freq: Once | INTRAMUSCULAR | Status: AC
  Administered 2018-08-18: 1 mg via INTRAVENOUS
  Filled 2018-08-18: qty 1

## 2018-08-18 MED ORDER — HYDROCODONE-ACETAMINOPHEN 5-325 MG PO TABS: 1.0000 | ORAL_TABLET | Freq: Four times a day (QID) | ORAL | 0 refills | Status: DC | PRN

## 2018-08-18 MED ORDER — IOHEXOL 300 MG/ML  SOLN
100.0000 mL | Freq: Once | INTRAMUSCULAR | Status: AC | PRN
  Administered 2018-08-18: 100 mL via INTRAVENOUS

## 2018-08-18 MED ORDER — ONDANSETRON 4 MG PO TBDP: ORAL_TABLET | ORAL | 0 refills | Status: DC

## 2018-08-18 NOTE — ED Triage Notes (Signed)
abd pain for 2 days   Menses "trying to start" Mainly R side   Denies N/V/D   No flu shot

## 2018-08-18 NOTE — Discharge Instructions (Signed)
Follow-up with Dr.eure in 1 to 2 weeks

## 2018-08-18 NOTE — ED Provider Notes (Signed)
[facility] EMERGENCY DEPARTMENT Provider Note   CSN: [id number] Arrival date & time: 08/18/18  1326    History   Chief Complaint Chief Complaint  Patient presents with  . Abdominal Pain    HPI [name] is a 53 y.o. female.     Patient complains of right lower quadrant pain with irregular periods.  The history is provided by the patient. No language interpreter was used.  Abdominal Pain  Pain location:  RLQ Pain quality: aching   Pain radiates to:  Does not radiate Pain severity:  Moderate Onset quality:  Sudden Timing:  Constant Progression:  Worsening Chronicity:  New Context: not alcohol use   Relieved by:  Nothing Worsened by:  Nothing Ineffective treatments:  None tried Associated symptoms: no chest pain, no cough, no diarrhea, no fatigue and no hematuria     Past Medical History:  Diagnosis Date  . Anemia     There are no active problems to display for this patient.   Past Surgical History:  Procedure Laterality Date  . BACK SURGERY    . TONSILLECTOMY       OB History    Gravida  3   Para  2   Term  2   Preterm      AB  1   Living  2     SAB  1   TAB      Ectopic      Multiple      Live Births  2            Home Medications    Prior to Admission medications   Medication Sig Start Date End Date Taking? Authorizing Provider  benzonatate (TESSALON) 200 MG capsule Take 1 capsule (200 mg total) by mouth every 8 (eight) hours. 10/05/17   Wieters, Hallie C, PA-C  FERROUS SULFATE PO Take by mouth 2 (two) times daily.    [provider]  HYDROcodone-acetaminophen (NORCO/VICODIN) 5-325 MG tablet Take 1 tablet by mouth every 6 (six) hours as needed. 08/18/18   Milton Ferguson, MD  Multiple Vitamin (MULTIVITAMIN) tablet Take 1 tablet by mouth. Takes once or twice a week    [provider]  ondansetron (ZOFRAN ODT) 4 MG disintegrating tablet 4mg  ODT q4 hours prn nausea/vomit 08/18/18   Milton Ferguson, MD     Family History Family History  Problem Relation Age of Onset  . Diabetes Maternal Grandmother   . Other Maternal Grandfather        brain tumor  . Diabetes Father   . Hypertension Father   . Diabetes Mother   . Diabetes Sister   . Diabetes Sister   . Asthma Daughter     Social History Social History   Tobacco Use  . Smoking status: Never Smoker  . Smokeless tobacco: Never Used  Substance Use Topics  . Alcohol use: No  . Drug use: No     Allergies   Patient has no known allergies.   Review of Systems Review of Systems  Constitutional: Negative for appetite change and fatigue.  HENT: Negative for congestion, ear discharge and sinus pressure.   Eyes: Negative for discharge.  Respiratory: Negative for cough.   Cardiovascular: Negative for chest pain.  Gastrointestinal: Positive for abdominal pain. Negative for diarrhea.  Genitourinary: Negative for frequency and hematuria.  Musculoskeletal: Negative for back pain.  Skin: Negative for rash.  Neurological: Negative for seizures and headaches.  Psychiatric/Behavioral: Negative for hallucinations.     Physical Exam  Updated Vital Signs BP (!) 146/77   Pulse 62   Temp 98.6 F (37 C) (Oral)   Resp 18   Ht 5\' 9"  (1.753 m)   Wt 85.3 kg   LMP 08/18/2018   SpO2 100%   BMI 27.76 kg/m   Physical Exam Vitals signs and nursing note reviewed.  Constitutional:      Appearance: She is well-developed.  HENT:     Head: Normocephalic.     Nose: Nose normal.  Eyes:     General: No scleral icterus.    Conjunctiva/sclera: Conjunctivae normal.  Neck:     Musculoskeletal: Neck supple.     Thyroid: No thyromegaly.  Cardiovascular:     Rate and Rhythm: Normal rate and regular rhythm.     Heart sounds: No murmur. No friction rub. No gallop.   Pulmonary:     Breath sounds: No stridor. No wheezing or rales.  Chest:     Chest wall: No tenderness.  Abdominal:     General: There is no distension.     Tenderness: There is  abdominal tenderness. There is no rebound.  Musculoskeletal: Normal range of motion.  Lymphadenopathy:     Cervical: No cervical adenopathy.  Skin:    Findings: No erythema or rash.  Neurological:     Mental Status: She is oriented to person, place, and time.     Motor: No abnormal muscle tone.     Coordination: Coordination normal.  Psychiatric:        Behavior: Behavior normal.      ED Treatments / Results  Labs (all labs ordered are listed, but only abnormal results are displayed) Labs Reviewed  COMPREHENSIVE METABOLIC PANEL - Abnormal; Notable for the following components:      Result Value   Calcium 8.8 (*)    All other components within normal limits  CBC - Abnormal; Notable for the following components:   Hemoglobin 10.9 (*)    HCT 35.6 (*)    MCH 24.9 (*)    RDW 17.6 (*)    All other components within normal limits  URINALYSIS, ROUTINE W REFLEX MICROSCOPIC - Abnormal; Notable for the following components:   Color, Urine AMBER (*)    APPearance CLOUDY (*)    Hgb urine dipstick LARGE (*)    Leukocytes,Ua LARGE (*)    Bacteria, UA RARE (*)    All other components within normal limits  URINE CULTURE  LIPASE, BLOOD  DIFFERENTIAL  I-STAT BETA HCG BLOOD, ED ([facility], [facility], [facility] ONLY)    EKG None  Radiology Ct Abdomen Pelvis W Contrast  Result Date: 08/18/2018 CLINICAL DATA:  Predominantly right-sided abdominal pain for 2 days. Nausea and vomiting. EXAM: CT ABDOMEN AND PELVIS WITH CONTRAST TECHNIQUE: Multidetector CT imaging of the abdomen and pelvis was performed using the standard protocol following bolus administration of intravenous contrast. CONTRAST:  16mL OMNIPAQUE IOHEXOL 300 MG/ML  SOLN COMPARISON:  None. FINDINGS: There is mild motion artifact through the lung bases and upper abdomen. Lower chest: Clear lung bases. Hepatobiliary: No focal liver abnormality is seen. No gallstones, gallbladder wall thickening, or biliary dilatation. Pancreas: Unremarkable. Spleen:  Unremarkable. Adrenals/Urinary Tract: Unremarkable adrenal glands. Subcentimeter low-density renal lesions bilaterally, too small to fully characterize. No evidence of renal calculi or hydronephrosis. Unremarkable bladder. Stomach/Bowel: The stomach is unremarkable. No bowel dilatation or significant inflammatory changes are identified. The appendix is unremarkable. Vascular/Lymphatic: No significant vascular findings are present. No enlarged abdominal or pelvic lymph nodes. Reproductive: 3 cm intramural mass  in the anterior lower uterine segment. No adnexal mass. Other: Trace pelvic free fluid. No pneumoperitoneum. Musculoskeletal: Mild disc degeneration and moderate facet spurring in the lumbar and lower thoracic spine. IMPRESSION: 1. No acute abnormality identified in the abdomen or pelvis. 2. 3 cm uterine fibroid. Electronically Signed   By: Logan Bores M.D.   On: 08/18/2018 15:52    Procedures Procedures (including critical care time)  Medications Ordered in ED Medications  ondansetron (ZOFRAN) injection 4 mg (4 mg Intravenous Given 08/18/18 1546)  HYDROmorphone (DILAUDID) injection 1 mg (1 mg Intravenous Given 08/18/18 1546)  iohexol (OMNIPAQUE) 300 MG/ML solution 100 mL (100 mLs Intravenous Contrast Given 08/18/18 1536)     Initial Impression / Assessment and Plan / ED Course  I have reviewed the triage vital signs and the nursing notes.  Pertinent labs & imaging results that were available during my care of the patient were reviewed by me and considered in my medical decision making (see chart for details).        Patient with fibroids in her uterus.  Labs unremarkable.  Except for mild anemia.  Also patient has some blood in her urine.  She will get a urine culture.  She will be given pain medicine nausea medicine and is referred to GYN for further treatment  Final Clinical Impressions(s) / ED Diagnoses   Final diagnoses:  Right lower quadrant abdominal pain    ED Discharge  Orders         Ordered    HYDROcodone-acetaminophen (NORCO/VICODIN) 5-325 MG tablet  Every 6 hours PRN     08/18/18 1650    ondansetron (ZOFRAN ODT) 4 MG disintegrating tablet     08/18/18 1650           Milton Ferguson, MD 08/18/18 1655

## 2018-08-20 LAB — URINE CULTURE: Culture: NO GROWTH

## 2018-08-28 ENCOUNTER — Encounter: Payer: Self-pay | Admitting: Obstetrics & Gynecology

## 2018-08-28 ENCOUNTER — Ambulatory Visit: Payer: BC Managed Care – PPO | Admitting: Obstetrics & Gynecology

## 2018-08-28 ENCOUNTER — Other Ambulatory Visit: Payer: Self-pay

## 2018-08-28 VITALS — BP 118/69 | HR 73 | Ht 65.5 in | Wt 197.0 lb

## 2018-08-28 DIAGNOSIS — D219 Benign neoplasm of connective and other soft tissue, unspecified: Secondary | ICD-10-CM

## 2018-08-28 DIAGNOSIS — N946 Dysmenorrhea, unspecified: Secondary | ICD-10-CM

## 2018-08-28 DIAGNOSIS — N951 Menopausal and female climacteric states: Secondary | ICD-10-CM

## 2018-08-28 MED ORDER — MEGESTROL ACETATE 40 MG PO TABS: ORAL_TABLET | ORAL | 3 refills | Status: DC

## 2018-08-28 NOTE — Progress Notes (Signed)
Chief Complaint  Patient presents with  . Advice Only    fibroids seen on CT scan in ED      53 y.o. [id number] Patient's last menstrual period was 08/18/2018. The current method of family planning is none.  Outpatient Encounter Medications as of 08/28/2018  Medication Sig  . Multiple Vitamin (MULTIVITAMIN) tablet Take 1 tablet by mouth. Takes once or twice a week  . HYDROcodone-acetaminophen (NORCO/VICODIN) 5-325 MG tablet Take 1 tablet by mouth every 6 (six) hours as needed. (Patient not taking: Reported on 08/28/2018)  . megestrol (MEGACE) 40 MG tablet 3 tablets a day for 5 days, 2 tablets a day for 5 days then 1 tablet daily  . ondansetron (ZOFRAN ODT) 4 MG disintegrating tablet 4mg  ODT q4 hours prn nausea/vomit (Patient not taking: Reported on 08/28/2018)  . [DISCONTINUED] benzonatate (TESSALON) 200 MG capsule Take 1 capsule (200 mg total) by mouth every 8 (eight) hours.  . [DISCONTINUED] FERROUS SULFATE PO Take by mouth 2 (two) times daily.   No facility-administered encounter medications on file as of 08/28/2018.     Subjective Pt was seen in ED with RLQ pin was on menses CT reveals small myoma 3 cm No other findings A little anemic with MCV low Also with discomfort with insertion and lower sexual drive Past Medical History:  Diagnosis Date  . Anemia     Past Surgical History:  Procedure Laterality Date  . BACK SURGERY    . TONSILLECTOMY      OB History    Gravida  3   Para  2   Term  2   Preterm      AB  1   Living  2     SAB  1   TAB      Ectopic      Multiple      Live Births  2           No Known Allergies  Social History   Socioeconomic History  . Marital status: Married    Spouse name: Not on file  . Number of children: Not on file  . Years of education: Not on file  . Highest education level: Not on file  Occupational History  . Not on file  Social Needs  . Financial resource strain: Not on file  . Food insecurity:   Worry: Not on file    Inability: Not on file  . Transportation needs:    Medical: Not on file    Non-medical: Not on file  Tobacco Use  . Smoking status: Never Smoker  . Smokeless tobacco: Never Used  Substance and Sexual Activity  . Alcohol use: No  . Drug use: No  . Sexual activity: Yes    Birth control/protection: None  Lifestyle  . Physical activity:    Days per week: Not on file    Minutes per session: Not on file  . Stress: Not on file  Relationships  . Social connections:    Talks on phone: Not on file    Gets together: Not on file    Attends religious service: Not on file    Active member of club or organization: Not on file    Attends meetings of clubs or organizations: Not on file    Relationship status: Not on file  Other Topics Concern  . Not on file  Social History Narrative  . Not on file    Family History  Problem Relation Age of  Onset  . Diabetes Maternal Grandmother   . Other Maternal Grandfather        brain tumor  . Diabetes Father   . Hypertension Father   . Diabetes Mother   . Diabetes Sister   . Diabetes Sister   . Asthma Daughter     Medications:       Current Outpatient Medications:  Marland Kitchen  Multiple Vitamin (MULTIVITAMIN) tablet, Take 1 tablet by mouth. Takes once or twice a week, Disp: , Rfl:  .  HYDROcodone-acetaminophen (NORCO/VICODIN) 5-325 MG tablet, Take 1 tablet by mouth every 6 (six) hours as needed. (Patient not taking: Reported on 08/28/2018), Disp: 20 tablet, Rfl: 0 .  megestrol (MEGACE) 40 MG tablet, 3 tablets a day for 5 days, 2 tablets a day for 5 days then 1 tablet daily, Disp: 45 tablet, Rfl: 3 .  ondansetron (ZOFRAN ODT) 4 MG disintegrating tablet, 4mg  ODT q4 hours prn nausea/vomit (Patient not taking: Reported on 08/28/2018), Disp: 12 tablet, Rfl: 0  Objective Blood pressure 118/69, pulse 73, height 5' 5.5" (1.664 m), weight 197 lb (89.4 kg), last menstrual period 08/18/2018.  Gen WDWN NAD  Pertinent ROS No burning with  urination, frequency or urgency No nausea, vomiting or diarrhea Nor fever chills or other constitutional symptoms   Labs or studies Reviewed CT and labs    Impression Diagnoses this Encounter::   ICD-10-CM   1. Dysmenorrhea N94.6   2. Perimenopausal N95.1   3. Fibroid D21.9     Established relevant diagnosis(es):   Plan/Recommendations: Meds ordered this encounter  Medications  . megestrol (MEGACE) 40 MG tablet    Sig: 3 tablets a day for 5 days, 2 tablets a day for 5 days then 1 tablet daily    Dispense:  45 tablet    Refill:  3    Labs or Scans Ordered: No orders of the defined types were placed in this encounter.   Management:: >megace for suppression >re evaluate for response in 6 weeks  Follow up Return in about 6 weeks (around 10/09/2018) for Follow up, with Dr Elonda Husky.        Face to face time:  20 minutes  Greater than 50% of the visit time was spent in counseling and coordination of care with the patient.  The summary and outline of the counseling and care coordination is summarized in the note above.   All questions were answered.

## 2018-10-09 ENCOUNTER — Ambulatory Visit: Payer: BC Managed Care – PPO | Admitting: Obstetrics & Gynecology

## 2018-12-30 ENCOUNTER — Other Ambulatory Visit: Payer: Self-pay

## 2018-12-30 DIAGNOSIS — Z20822 Contact with and (suspected) exposure to covid-19: Secondary | ICD-10-CM

## 2019-01-02 LAB — NOVEL CORONAVIRUS, NAA: SARS-CoV-2, NAA: NOT DETECTED

## 2019-02-26 ENCOUNTER — Emergency Department (HOSPITAL_COMMUNITY)
Admission: EM | Admit: 2019-02-26 | Discharge: 2019-02-26 | Discharge status: Against Medical Advice | Payer: BC Managed Care – PPO | Attending: Emergency Medicine | Admitting: Emergency Medicine

## 2019-02-26 ENCOUNTER — Emergency Department (HOSPITAL_COMMUNITY): Payer: BC Managed Care – PPO

## 2019-02-26 ENCOUNTER — Other Ambulatory Visit: Payer: Self-pay

## 2019-02-26 DIAGNOSIS — Z5321 Procedure and treatment not carried out due to patient leaving prior to being seen by health care provider: Secondary | ICD-10-CM | POA: Insufficient documentation

## 2019-02-26 DIAGNOSIS — R0789 Other chest pain: Secondary | ICD-10-CM | POA: Diagnosis present

## 2019-02-26 MED ORDER — SODIUM CHLORIDE 0.9% FLUSH: 3.0000 mL | Freq: Once | INTRAVENOUS | Status: DC

## 2019-02-26 NOTE — ED Triage Notes (Signed)
Pt presents with c/o chest pain that started today. Pt also c/o pain that radiates to her left arm in conjunction with the chest pain. Pt reports the pain as sharp.

## 2019-03-21 ENCOUNTER — Encounter (HOSPITAL_COMMUNITY): Payer: Self-pay | Admitting: Emergency Medicine

## 2019-03-21 ENCOUNTER — Emergency Department (HOSPITAL_COMMUNITY)
Admission: EM | Admit: 2019-03-21 | Discharge: 2019-03-21 | Disposition: A | Discharge status: Home / Self Care | Payer: BC Managed Care – PPO | Source: Home / Self Care

## 2019-03-21 ENCOUNTER — Encounter (HOSPITAL_COMMUNITY): Payer: Self-pay | Admitting: *Deleted

## 2019-03-21 ENCOUNTER — Other Ambulatory Visit: Payer: Self-pay

## 2019-03-21 ENCOUNTER — Emergency Department (HOSPITAL_COMMUNITY)
Admission: EM | Admit: 2019-03-21 | Discharge: 2019-03-21 | Disposition: A | Discharge status: Home / Self Care | Payer: BC Managed Care – PPO | Attending: Emergency Medicine | Admitting: Emergency Medicine

## 2019-03-21 ENCOUNTER — Emergency Department (HOSPITAL_COMMUNITY): Payer: BC Managed Care – PPO

## 2019-03-21 DIAGNOSIS — Y999 Unspecified external cause status: Secondary | ICD-10-CM | POA: Insufficient documentation

## 2019-03-21 DIAGNOSIS — M25572 Pain in left ankle and joints of left foot: Secondary | ICD-10-CM | POA: Insufficient documentation

## 2019-03-21 DIAGNOSIS — Z5321 Procedure and treatment not carried out due to patient leaving prior to being seen by health care provider: Secondary | ICD-10-CM | POA: Insufficient documentation

## 2019-03-21 DIAGNOSIS — Y9301 Activity, walking, marching and hiking: Secondary | ICD-10-CM | POA: Insufficient documentation

## 2019-03-21 DIAGNOSIS — Z79899 Other long term (current) drug therapy: Secondary | ICD-10-CM | POA: Insufficient documentation

## 2019-03-21 DIAGNOSIS — S82892A Other fracture of left lower leg, initial encounter for closed fracture: Secondary | ICD-10-CM

## 2019-03-21 DIAGNOSIS — Y929 Unspecified place or not applicable: Secondary | ICD-10-CM | POA: Insufficient documentation

## 2019-03-21 DIAGNOSIS — W109XXA Fall (on) (from) unspecified stairs and steps, initial encounter: Secondary | ICD-10-CM | POA: Insufficient documentation

## 2019-03-21 DIAGNOSIS — S99911A Unspecified injury of right ankle, initial encounter: Secondary | ICD-10-CM | POA: Diagnosis present

## 2019-03-21 MED ORDER — HYDROCODONE-ACETAMINOPHEN 5-325 MG PO TABS: 1.0000 | ORAL_TABLET | Freq: Four times a day (QID) | ORAL | 0 refills | Status: DC | PRN

## 2019-03-21 MED ORDER — ACETAMINOPHEN 325 MG PO TABS
650.0000 mg | ORAL_TABLET | Freq: Once | ORAL | Status: AC
  Administered 2019-03-21: 650 mg via ORAL
  Filled 2019-03-21: qty 2

## 2019-03-21 MED ORDER — IBUPROFEN 600 MG PO TABS: 600.0000 mg | ORAL_TABLET | Freq: Four times a day (QID) | ORAL | 0 refills | Status: DC | PRN

## 2019-03-21 NOTE — ED Notes (Signed)
ED Provider at bedside. 

## 2019-03-21 NOTE — ED Triage Notes (Signed)
Pt reports her left knee gave out this morning and she fell down 4 steps injuring her left ankle. Pt reports the pain radiates from the left ankle up into the left leg above the knee. Pt reports her toes feel numb.

## 2019-03-21 NOTE — Discharge Instructions (Signed)
Your testing today shows that you have a fracture of your left ankle, this will need to be seen by orthopedics.  I have given you the phone number for our local orthopedist who is on-call for our hospital, Dr. Aline Brochure as well as the orthopedist on-call in [place], you may choose which one you would like to go to.  Please call the office today to make a follow-up appointment for the next 2 to 3 days.  Seek a medical exam for increasing pain, swelling, numbness, weakness or any other severe or worsening symptoms.  Please keep your splint in place until you follow-up, use the crutches and make sure that you are not putting any weight on this ankle or foot.  Nonweightbearing is essential.  Ibuprofen for pain, hydrocodone only for severe pain, this may make you constipated to make sure you take a stool softener if you take it.  Rest, Ice, Elevate

## 2019-03-21 NOTE — ED Notes (Signed)
Pt back from x-ray.

## 2019-03-21 NOTE — ED Triage Notes (Signed)
Patient states she was here this morning and had splint applied to left ankle. States approximately 1 hour ago she started having pain and crawling sensation to back of left leg. Toes are warm upon auscultation.

## 2019-03-21 NOTE — ED Provider Notes (Signed)
[facility] EMERGENCY DEPARTMENT Provider Note   CSN: [id number] Arrival date & time: 03/21/19  0703     History   Chief Complaint Chief Complaint  Patient presents with   Ankle Pain    HPI [name] is a 53 y.o. female.     HPI  53 y/o female - reports hx of knee pain on the L with crunching feeling when she bends and it and "giving out" that occurs occasionally - this occurred again this morning when she was walking down the stairs.  This occurred just prior to arrival, she fell down approximately 4 stairs and injured her left ankle during the fall.  She was unable to get up off of the ground secondary to pain in the ankle.  She denies any other injuries including head injury neck pain or back pain.  She had no medications prior to arrival.  The pain in the ankle is persistent, worse with trying to move it or touch it, associated with mild swelling.  Past Medical History:  Diagnosis Date   Anemia     There are no active problems to display for this patient.   Past Surgical History:  Procedure Laterality Date   BACK SURGERY     TONSILLECTOMY       OB History    Gravida  3   Para  2   Term  2   Preterm      AB  1   Living  2     SAB  1   TAB      Ectopic      Multiple      Live Births  2            Home Medications    Prior to Admission medications   Medication Sig Start Date End Date Taking? Authorizing Provider  HYDROcodone-acetaminophen (NORCO/VICODIN) 5-325 MG tablet Take 1 tablet by mouth every 6 (six) hours as needed for up to 3 days. 03/21/19 03/24/19  Noemi Chapel, MD  ibuprofen (ADVIL) 600 MG tablet Take 1 tablet (600 mg total) by mouth every 6 (six) hours as needed. 03/21/19   Noemi Chapel, MD  megestrol (MEGACE) 40 MG tablet 3 tablets a day for 5 days, 2 tablets a day for 5 days then 1 tablet daily 08/28/18   Florian Buff, MD  Multiple Vitamin (MULTIVITAMIN) tablet Take 1 tablet by mouth. Takes once or twice a week     [provider]    Family History Family History  Problem Relation Age of Onset   Diabetes Maternal Grandmother    Other Maternal Grandfather        brain tumor   Diabetes Father    Hypertension Father    Diabetes Mother    Diabetes Sister    Diabetes Sister    Asthma Daughter     Social History Social History   Tobacco Use   Smoking status: Never Smoker   Smokeless tobacco: Never Used  Substance Use Topics   Alcohol use: No   Drug use: No     Allergies   Patient has no known allergies.   Review of Systems Review of Systems  Musculoskeletal: Positive for arthralgias. Negative for back pain and neck pain.  Skin: Negative for rash and wound.  Neurological: Positive for numbness ( L great toe).     Physical Exam Updated Vital Signs BP (!) 141/78    Pulse (!) 55    Temp 97.8 F (36.6 C) (  Oral)    Resp 18    Ht 1.664 m (5' 5.5")    Wt 86.2 kg    LMP 08/18/2018    SpO2 100%    BMI 31.14 kg/m   Physical Exam Vitals signs and nursing note reviewed.  Constitutional:      Appearance: She is well-developed. She is not diaphoretic.  HENT:     Head: Normocephalic and atraumatic.  Eyes:     General:        Right eye: No discharge.        Left eye: No discharge.     Conjunctiva/sclera: Conjunctivae normal.  Pulmonary:     Effort: Pulmonary effort is normal. No respiratory distress.  Musculoskeletal:     Comments: Has some crepitance in the L knee with ROM but can straight leg raise.  Has some dec ROM of the L ankle - with ttp laterally more than medially - normal pulses at the DP on the L.  No wounds, no redness, no bleeding.  Skin:    General: Skin is warm and dry.     Findings: No erythema or rash.  Neurological:     Mental Status: She is alert.     Coordination: Coordination normal.     Comments: Slight dec sensation to the great toe on the L.      ED Treatments / Results  Labs (all labs ordered are listed, but only abnormal  results are displayed) Labs Reviewed - No data to display  EKG None  Radiology Dg Ankle Complete Left  Result Date: 03/21/2019 CLINICAL DATA:  Pain following fall EXAM: LEFT ANKLE COMPLETE - 3+ VIEW COMPARISON:  None. FINDINGS: Frontal, oblique, and lateral views were obtained. There is a nondisplaced spiral fracture of the distal fibular diaphysis and proximal metaphysis with alignment essentially anatomic. No other fracture evident. No appreciable joint effusion. There is no appreciable joint space narrowing or erosion. There are posteroinferior calcaneal spurs. The ankle mortise appears intact. There is pes planus. IMPRESSION: Nondisplaced spiral fracture distal fibula. Ankle mortise appears grossly intact. There are calcaneal spurs. No appreciable joint space narrowing or erosion. Electronically Signed   By: Lowella Grip III M.D.   On: 03/21/2019 08:40   Dg Knee Complete 4 Views Left  Result Date: 03/21/2019 CLINICAL DATA:  Pain following fall EXAM: LEFT KNEE - COMPLETE 4+ VIEW COMPARISON:  None. FINDINGS: Frontal, lateral, and bilateral oblique views were obtained. There is a small focus of calcification medial to the medial most aspect of the tibial plateau which may represent a tiny avulsion injury. A second focus of calcification is noted in the midportion of the knee joint which may represent a small avulsion of uncertain origin. No other evidence of potential fractures elsewhere. No dislocation. No appreciable joint effusion. There is moderate narrowing medially with milder narrowing in the patellofemoral joint region. There is spurring medially and arising from the patella. There are anterior patellar spurs superiorly and inferiorly as well. No erosive changes. IMPRESSION: 1. Suspect tiny avulsion arising from the medial most aspect of the medial tibial plateau. 2. There is a small calcification in the midportion of the knee joint, Hendler seen on the frontal view, which may represent an  avulsion of uncertain origin. 3. Moderate osteoarthritic change, most notably medially but also involving the patellofemoral joint. There is spurring medially and in the patellofemoral joint region. Spurring along the anterior aspect of the patella may represent a degree quadriceps and patellar tendinosis. 4.  No joint effusion.  No dislocation. Electronically Signed   By: Lowella Grip III M.D.   On: 03/21/2019 08:39    Procedures .Splint Application  Date/Time: 03/21/2019 8:46 AM Performed by: Noemi Chapel, MD Authorized by: Noemi Chapel, MD   Consent:    Consent obtained:  Verbal   Consent given by:  Patient   Risks discussed:  Discoloration, numbness, pain and swelling   Alternatives discussed:  No treatment Pre-procedure details:    Sensation:  Normal Procedure details:    Laterality:  Left   Location:  Ankle   Ankle:  L ankle   Splint type:  Ankle stirrup   Supplies:  Ortho-Glass Post-procedure details:    Pain:  Improved   Sensation:  Normal   Patient tolerance of procedure:  Tolerated well, no immediate complications Comments:       .Splint Application  Date/Time: 03/21/2019 8:47 AM Performed by: Noemi Chapel, MD Authorized by: Noemi Chapel, MD   Consent:    Consent obtained:  Verbal   Consent given by:  Patient   Risks discussed:  Discoloration, numbness, pain and swelling   Alternatives discussed:  No treatment Pre-procedure details:    Sensation:  Normal Procedure details:    Laterality:  Left   Location:  Ankle   Ankle:  L ankle   Splint type:  Short leg   Supplies:  Ortho-Glass Post-procedure details:    Pain:  Improved   Sensation:  Normal   Patient tolerance of procedure:  Tolerated well, no immediate complications Comments:         (including critical care time)  Medications Ordered in ED Medications  acetaminophen (TYLENOL) tablet 650 mg (650 mg Oral Given 03/21/19 0741)     Initial Impression / Assessment and Plan / ED Course    I have reviewed the triage vital signs and the nursing notes.  Pertinent labs & imaging results that were available during my care of the patient were reviewed by me and considered in my medical decision making (see chart for details).       Ankle injury, likely predisposed secondary to arthritis in the knee causing the fall.  No other signs of injury from the fall.  We will image both the knee and the ankle, rice therapy initiated, patient request Tylenol and nothing stronger.  Radiology agrees with my interpretation that there is likely a spiral fracture of the distal fibula, the ankle mortise is intact, this is a closed wound, the patient has tolerated the splint which I personally applied.  The patient's neurovascular status was reevaluated after splint placement, she has normal sensation, normal pulses and feels well.  She understands the indications for return.  She was referred to both local orthopedics as well as orthopedics in [place] where she lives.  She is appreciative of the referral, she will be discharged with a small amount of Vicodin as well as ibuprofen, the patient is stable for discharge in the care of her husband at this time.  Final Clinical Impressions(s) / ED Diagnoses   Final diagnoses:  Closed fracture of left ankle, initial encounter    ED Discharge Orders         Ordered    ibuprofen (ADVIL) 600 MG tablet  Every 6 hours PRN     03/21/19 0844    HYDROcodone-acetaminophen (NORCO/VICODIN) 5-325 MG tablet  Every 6 hours PRN     03/21/19 0844           Noemi Chapel, MD 03/21/19 [phone]

## 2019-03-23 ENCOUNTER — Other Ambulatory Visit: Payer: Self-pay

## 2019-03-23 ENCOUNTER — Encounter: Payer: Self-pay | Admitting: Orthopedic Surgery

## 2019-03-23 ENCOUNTER — Ambulatory Visit: Payer: BC Managed Care – PPO | Admitting: Orthopedic Surgery

## 2019-03-23 VITALS — BP 116/67 | HR 64 | Temp 98.1°F | Ht 65.5 in | Wt 190.0 lb

## 2019-03-23 DIAGNOSIS — M25362 Other instability, left knee: Secondary | ICD-10-CM

## 2019-03-23 DIAGNOSIS — S8265XA Nondisplaced fracture of lateral malleolus of left fibula, initial encounter for closed fracture: Secondary | ICD-10-CM

## 2019-03-23 MED ORDER — IBUPROFEN 800 MG PO TABS: 800.0000 mg | ORAL_TABLET | Freq: Three times a day (TID) | ORAL | 0 refills | Status: DC | PRN

## 2019-03-23 MED ORDER — HYDROCODONE-ACETAMINOPHEN 10-325 MG PO TABS: 1.0000 | ORAL_TABLET | Freq: Four times a day (QID) | ORAL | 0 refills | Status: AC | PRN

## 2019-03-23 NOTE — Progress Notes (Signed)
Patient ID: [name], female   DOB: [birth year], 53 y.o.   MRN: [id number]  Chief Complaint  Patient presents with  . Ankle Injury    Left ankle fracture, DOI 03-21-19.    HPI [name] is a 53 y.o. female.  Who presents for evaluation of left ankle fracture seen in the ER ER records have been reviewed pertinent information has been carried forward  The patient simply with walking up a step or coming down a step her left knee which have been troubling her for about 6 months gave way and she fractured her left ankle  Location left ankle Duration 2 days Quality aching pain lateral side Severity pain severe not responding well to 5 mg hydrocodone or 600 of ibuprofen though ibuprofen helped more Associated with swelling and loss of ability to bear weight   Review of Systems Review of Systems  All other systems reviewed and are negative.    Past Medical History:  Diagnosis Date  . Anemia     Past Surgical History:  Procedure Laterality Date  . BACK SURGERY    . TONSILLECTOMY      Family History  Problem Relation Age of Onset  . Diabetes Maternal Grandmother   . Other Maternal Grandfather        brain tumor  . Diabetes Father   . Hypertension Father   . Diabetes Mother   . Diabetes Sister   . Diabetes Sister   . Asthma Daughter     Social History Social History   Tobacco Use  . Smoking status: Never Smoker  . Smokeless tobacco: Never Used  Substance Use Topics  . Alcohol use: No  . Drug use: No    No Known Allergies  Current Outpatient Medications  Medication Sig Dispense Refill  . megestrol (MEGACE) 40 MG tablet 3 tablets a day for 5 days, 2 tablets a day for 5 days then 1 tablet daily 45 tablet 3  . Multiple Vitamin (MULTIVITAMIN) tablet Take 1 tablet by mouth. Takes once or twice a week    . HYDROcodone-acetaminophen (NORCO) 10-325 MG tablet Take 1 tablet by mouth every 6 (six) hours as needed for up to 5 days for severe pain. 20 tablet 0  .  ibuprofen (ADVIL) 800 MG tablet Take 1 tablet (800 mg total) by mouth every 8 (eight) hours as needed. 30 tablet 0   No current facility-administered medications for this visit.        Physical Exam BP 116/67   Pulse 64   Temp 98.1 F (36.7 C)   Ht 5' 5.5" (1.664 m)   Wt 190 lb (86.2 kg)   LMP 08/18/2018   BMI 31.14 kg/m  Physical Exam The patient is well developed well nourished and well groomed.  Orientation to person place and time is normal  Mood is pleasant.  Ambulatory status ambulatory nonweightbearing with splint and crutches Ortho Exam  Left ankle examination: Inspection reveals tenderness and swelling over the lateral malleolus. The medial malleolus is nontender  Range of motion is limited by pain foot is plantigrade. Ankle stability tests were deferred because of pain in the ankle does not appear to be subluxated. Motor exam shows no atrophy  Skin shows mild bruising and ecchymosis. Neurovascular exam is intact.  Opposite ankle shows no deformity. ROM is normal       MEDICAL DECISION SECTION  Prior outside images have been reviewed and the report is included by reference.  It confirms the fracture.  I have also read the x-ray and give the following personal interpretation of the films:  Nondisplaced lateral malleolus fracture with intact mortise  Any prior office visits and ER records have been reviewed and confirmed the diagnosis of ankle fracture   Encounter Diagnoses  Name Primary?  . Nondisplaced fracture of lateral malleolus of left fibula, initial encounter for closed fracture Yes  . Instability of left knee joint      PLAN:   Economy hinged brace left knee for left knee instability  Cam walker left ankle for lateral malleolus fracture  Use crutches 7 to 10 days  Increase hydrocodone to 10 mg 325  Continue ibuprofen at 800 mg every 6 as needed   X-ray in 2 weeks  Treatment plan is x-ray in 2 weeks if change in position patient  aware we will have to convert to surgical treatment    Meds ordered this encounter  Medications  . HYDROcodone-acetaminophen (NORCO) 10-325 MG tablet    Sig: Take 1 tablet by mouth every 6 (six) hours as needed for up to 5 days for severe pain.    Dispense:  20 tablet    Refill:  0  . ibuprofen (ADVIL) 800 MG tablet    Sig: Take 1 tablet (800 mg total) by mouth every 8 (eight) hours as needed.    Dispense:  30 tablet    Refill:  0

## 2019-03-23 NOTE — Patient Instructions (Addendum)
Work note 6 weeks out    Ankle Fracture The ankle joint is made up of the lower (distal) sections of your lower leg bones (tibia and fibula) along with a bone in your foot (talus). An ankle fracture is a break in one, two, or all three of these sections of bone. There are two general types of ankle fractures:  Stable fracture. This happens when one of your bones is broken, but the bones of your ankle joint stay in their normal positions.  Unstable fracture. This type can include more than one broken bone. It can also happen if your outer bone is broken and the tough bands of tissue that connect bones (ligaments) are also injured at your inner ankle. This type of fracture allows the talus to move out of its normal position. What are the causes? This condition may be caused by:  A hard, direct hit (blow) to the ankle.  Quickly and severely twisting your ankle, often while your foot is planted and the rest of your body moving.  Trauma, such as a car accident or falling from a height. What increases the risk? This condition is more likely to occur in people who:  Smoke.  Are overweight.  Participate in sports that involve quick direction changes, as in soccer.  Do high-impact sports like gymnastics or football.  Are involved in a high-impact car accident. What are the signs or symptoms? Symptoms of this condition include:  Tender and swollen ankle.  Bruising around the injured ankle.  Pain when moving or pressing on the ankle.  Trouble walking or using the ankle to support your body weight (putting weight on the ankle).  Pain that gets worse when moving or standing and gets better with rest. How is this diagnosed? An ankle fracture is usually diagnosed with a physical exam and X-rays. A CT scan or MRI may also be done. How is this treated? Treatment for this condition depends on the type of ankle fracture you have. Stable fractures are treated with a cast, boot, or splint to  hold the ankle still and crutches to avoid putting weight on the injured ankle until the fracture heals. Unstable fractures require surgery to ensure that the bones heal properly. After surgery, you will have a splint. After your incision is healed, your surgeon may give you a cast or a boot. You will not be able to put weight on your injured side for several weeks. After your ankle has healed, you will do exercises to improve the strength and mobility of your ankle. Follow these instructions at home: If you have a splint:  Wear the splint as told by your health care provider. Remove it only as told by your health care provider.  Loosen the splint if your toes tingle, become numb, or turn cold and blue.  Keep the splint clean.  If the splint is not waterproof: ? Do not let it get wet. ? Cover it with a watertight covering when you take a bath or a shower. If you have a cast:  Do not stick anything inside the cast to scratch your skin. Doing that increases your risk of infection.  Check the skin around the cast every day. Tell your health care provider about any concerns.  You may put lotion on dry skin around the edges of the cast. Do not put lotion on the skin underneath the cast.  Keep the cast clean.  If the cast is not waterproof: ? Do not let it get  wet. ? Cover it with a watertight covering when you take a bath or a shower. Managing pain, stiffness, and swelling   If directed, put ice on the injured area: ? If you have a removable splint, remove it as told by your health care provider. ? Put ice in a plastic bag. ? Place a towel between your skin and the bag or between your cast and the bag. ? Leave the ice on for 20 minutes, 2-3 times a day.  Move your toes often to avoid stiffness and to lessen swelling.  Raise (elevate) the injured area above the level of your heart while you are sitting or lying down. General instructions  Do not use the injured limb to support your  body weight until your health care provider says that you can. Use crutches as told by your health care provider  Take over-the-counter and prescription medicines only as told by your health care provider.  Ask your health care provider when it is safe to drive if you have a cast or splint.  Do exercises as told by your health care provider.  Do not use any products that contain nicotine or tobacco, such as cigarettes and e-cigarettes. These can delay bone healing. If you need help quitting, ask your health care provider  Keep all follow-up visits as told by your health care provider. This is important. Contact a health care provider if:  You have pain or swelling that gets worse or does not get better with rest or medicine. Get help right away if:  Your cast gets damaged.  You have severe pain that lasts.  You develop new pain or swelling.  Your skin or toenails below the injury turn blue or gray, feel cold, become numb, or have a loss of sensitivity to touch. Summary  An ankle fracture can either be stable or unstable. This is determined after a physical exam and imaging studies like X-rays, a CT scan, or MRI.  Stable fractures are treated with a cast, boot, or splint to hold the ankle still until the fracture heals. Unstable fractures require surgery to ensure that the bones heal properly.  You will not be able to put weight on your injured side for several weeks.  Pain medicines, icing, and raising (elevating) your injured ankle when sitting or lying down may help with pain relief. Follow instructions as told by your health care provider. This information is not intended to replace advice given to you by your health care provider. Make sure you discuss any questions you have with your health care provider. Document Released: 05/21/2000 Document Revised: 08/03/2018 Document Reviewed: 06/25/2016 Elsevier Patient Education  2020 [employer].

## 2019-03-27 ENCOUNTER — Telehealth: Payer: Self-pay | Admitting: Orthopedic Surgery

## 2019-03-27 NOTE — Telephone Encounter (Signed)
Patient called with questions regarding how her left foot feels during having to transfer to bathroom and in similar instances; said it hurts and swells. States has been non-weight bearing. Please advise.

## 2019-03-27 NOTE — Telephone Encounter (Addendum)
Called, left message for her to call me back she should ice elevate, swelling should be decreasing at night, will discuss with her when she calls back,

## 2019-03-27 NOTE — Telephone Encounter (Signed)
Patient does not see any swelling she has been icing and elevating  She states she has severe pain bottom of her foot when she gets up to go to the bathroom. She can not see any swelling, wants to know what to do for this, please advise

## 2019-03-28 NOTE — Telephone Encounter (Signed)
She states she feels like the splint gave her more support, she wants to know if she can go back in splint instead of the boot.   Discussed with Dr Aline Brochure, he states that is fine. She is coming tomorrow at 130 for me to place her in a sugartong splint

## 2019-03-28 NOTE — Telephone Encounter (Signed)
Left message for her to call me back. 

## 2019-03-28 NOTE — Telephone Encounter (Signed)
She has been non weight bearing

## 2019-03-28 NOTE — Telephone Encounter (Signed)
Ok, take the boot off it may be causing the heel pain

## 2019-03-28 NOTE — Telephone Encounter (Signed)
Go back to non weight bearing for 5 days

## 2019-03-29 ENCOUNTER — Other Ambulatory Visit: Payer: Self-pay

## 2019-03-29 ENCOUNTER — Encounter: Payer: Self-pay | Admitting: Orthopedic Surgery

## 2019-03-29 ENCOUNTER — Ambulatory Visit (INDEPENDENT_AMBULATORY_CARE_PROVIDER_SITE_OTHER): Payer: BC Managed Care – PPO | Admitting: Orthopedic Surgery

## 2019-03-29 DIAGNOSIS — S8265XA Nondisplaced fracture of lateral malleolus of left fibula, initial encounter for closed fracture: Secondary | ICD-10-CM

## 2019-03-29 NOTE — Progress Notes (Signed)
Patient has foot pain while ambulating non weight bearing in boot. She was placed in a plaster sugartong splint which helped with her foot pain. She will keep appointment as scheduled   I have advised her when she returns to let Dr Aline Brochure know her foot is painful, in case he decides she may need xray of her foot. Her pain is located at her 4th MT area bottom of foot

## 2019-03-30 ENCOUNTER — Telehealth: Payer: Self-pay | Admitting: Radiology

## 2019-03-30 NOTE — Telephone Encounter (Signed)
Left message for patient to let me know if the new splint did not help her pain. If not I want to work her in sooner for Dr Aline Brochure to check her foot

## 2019-04-05 DIAGNOSIS — S8265XA Nondisplaced fracture of lateral malleolus of left fibula, initial encounter for closed fracture: Secondary | ICD-10-CM | POA: Insufficient documentation

## 2019-04-06 ENCOUNTER — Encounter: Payer: Self-pay | Admitting: Orthopedic Surgery

## 2019-04-06 ENCOUNTER — Other Ambulatory Visit: Payer: Self-pay

## 2019-04-06 ENCOUNTER — Ambulatory Visit: Payer: BC Managed Care – PPO

## 2019-04-06 ENCOUNTER — Ambulatory Visit (INDEPENDENT_AMBULATORY_CARE_PROVIDER_SITE_OTHER): Payer: BC Managed Care – PPO | Admitting: Orthopedic Surgery

## 2019-04-06 DIAGNOSIS — S8265XD Nondisplaced fracture of lateral malleolus of left fibula, subsequent encounter for closed fracture with routine healing: Secondary | ICD-10-CM

## 2019-04-06 DIAGNOSIS — M79672 Pain in left foot: Secondary | ICD-10-CM

## 2019-04-06 NOTE — Progress Notes (Signed)
Fracture care follow-up fracture left ankle  Patient complains of pain in the fourth digit  Also swelling noted  Tenderness under the foot she has a callus there which is chronic but her toe looks swollen compared to the other side I think she sprained the DIP or PIP joint  Recommend buddy taping which was done and helped  We placed her in a short leg cast that felt better than the boot her x-ray looks good  Recommend 2-week follow-up to change the cast  Encounter Diagnoses  Name Primary?  . Closed nondisplaced fracture of lateral malleolus of left fibula with routine healing, subsequent encounter 03/21/2019 Yes  . Acute pain of left foot

## 2019-04-20 ENCOUNTER — Other Ambulatory Visit: Payer: Self-pay

## 2019-04-20 ENCOUNTER — Ambulatory Visit (INDEPENDENT_AMBULATORY_CARE_PROVIDER_SITE_OTHER): Payer: BC Managed Care – PPO | Admitting: Orthopedic Surgery

## 2019-04-20 ENCOUNTER — Encounter: Payer: Self-pay | Admitting: Orthopedic Surgery

## 2019-04-20 DIAGNOSIS — S8265XD Nondisplaced fracture of lateral malleolus of left fibula, subsequent encounter for closed fracture with routine healing: Secondary | ICD-10-CM

## 2019-04-20 NOTE — Progress Notes (Signed)
Chief Complaint  Patient presents with  . Ankle Injury    03/21/2019 left ankle injury fracture     Lateral malleolus fracture treated with cast patient having trouble with her knee.  She was in a brace but had trouble getting it all because of the cast  Foot and skin look normal cast change foot brought up to a more neutral position  X-ray in 3 weeks should be okay to remove the cast at that time and go to a brace if necessary  Short leg cast applied

## 2019-04-20 NOTE — Patient Instructions (Signed)
WBAT

## 2019-05-11 ENCOUNTER — Ambulatory Visit (INDEPENDENT_AMBULATORY_CARE_PROVIDER_SITE_OTHER): Payer: BC Managed Care – PPO | Admitting: Orthopedic Surgery

## 2019-05-11 ENCOUNTER — Encounter: Payer: Self-pay | Admitting: Orthopedic Surgery

## 2019-05-11 ENCOUNTER — Ambulatory Visit: Payer: BC Managed Care – PPO

## 2019-05-11 ENCOUNTER — Other Ambulatory Visit: Payer: Self-pay

## 2019-05-11 DIAGNOSIS — S8265XD Nondisplaced fracture of lateral malleolus of left fibula, subsequent encounter for closed fracture with routine healing: Secondary | ICD-10-CM

## 2019-05-11 DIAGNOSIS — S83242D Other tear of medial meniscus, current injury, left knee, subsequent encounter: Secondary | ICD-10-CM

## 2019-05-11 NOTE — Progress Notes (Signed)
POST OP  __________   Chief Complaint  Patient presents with  . Ankle Pain    L/doing good 03/21/2019  . Knee Pain    Left feels unstable    LMP 08/18/2018   This is a 53 year old female treated for nondisplaced fibular fracture this is week #7 with a date of injury of October 14 she has been in a cast  Cast off shows that she can get the foot to neutral position +5 degrees although she still has ankle stiffness  The fracture site is nontender  SEPARATE PROBLEM:   LEFT KNEE PAIN   October 14 fell injured left ankle and knee fracturing the ankle which is been the primary thing we have been treating but she is complained of medial knee pain and instability since the injury  Review of systems loss of motion   Physical Exam Constitutional:      General: She is not in acute distress.    Appearance: Normal appearance.  Musculoskeletal:     Left knee: She exhibits decreased range of motion, effusion and abnormal meniscus. She exhibits no swelling, no ecchymosis, no deformity, no laceration, no erythema, normal alignment, no LCL laxity, normal patellar mobility and no MCL laxity. Tenderness found. Medial joint line tenderness noted.       Legs:  Skin:    General: Skin is warm and dry.     Capillary Refill: Capillary refill takes less than 2 seconds.  Neurological:     General: No focal deficit present.     Mental Status: She is oriented to person, place, and time.  Psychiatric:        Mood and Affect: Mood normal.        Thought Content: Thought content normal.      More concerning today is her left knee instability which has persisted since October 14 and despite NSAIDs home exercise and bracing she still has medial joint pain with painful knee flexion and extension positive McMurray's sign small joint effusion  Knee feels unstable  Recommend MRI left knee patient will come in for those results.  Encounter Diagnoses  Name Primary?  . Closed nondisplaced fracture of  lateral malleolus of left fibula with routine healing, subsequent encounter 03/21/2019 Yes  . Other tear of medial meniscus of left knee as current injury, subsequent encounter

## 2019-05-11 NOTE — Patient Instructions (Addendum)
Knee brace  Ankle exercises  Crutches   Weight bearing as tolerated   MRI F/U   You have been scheduled for an MRI scan  We will call your insurance company to do a precertification to get the MRI covered  You will receive a phone call regarding the date of the scan

## 2019-05-18 ENCOUNTER — Ambulatory Visit (HOSPITAL_COMMUNITY)
Admission: RE | Admit: 2019-05-18 | Discharge: 2019-05-18 | Disposition: A | Discharge status: Home / Self Care | Payer: BC Managed Care – PPO | Source: Ambulatory Visit | Attending: Orthopedic Surgery | Admitting: Orthopedic Surgery

## 2019-05-18 ENCOUNTER — Other Ambulatory Visit: Payer: Self-pay

## 2019-05-18 DIAGNOSIS — S83242D Other tear of medial meniscus, current injury, left knee, subsequent encounter: Secondary | ICD-10-CM

## 2019-05-22 ENCOUNTER — Telehealth: Payer: Self-pay | Admitting: Orthopedic Surgery

## 2019-05-22 NOTE — Telephone Encounter (Signed)
Results given  Patient given options of further nonoperative care versus surgical treatment with a 4-week recovery  Same day surgery  Arthritic changes noted with loose bodies in the joint probably long-term chronic degenerative problem with acute longitudinal meniscal tear from the fall   IMPRESSION: 1. Longitudinal tear of the periphery of the posterior horn of the medial meniscus extending into the meniscal root. 2. Focal full-thickness cartilage loss of the posterior aspect of the medial femoral condyle. 3. Multiple loose bodies in the joint. 4. Extensive extravasated joint fluid around the muscles of the posterior aspect of the calf, likely due to a ruptured Baker's cyst.     Electronically Signed   By: Lorriane Shire M.D.   On: 05/18/2019 19:26

## 2019-08-18 ENCOUNTER — Ambulatory Visit: Payer: BC Managed Care – PPO | Attending: Internal Medicine

## 2019-08-18 DIAGNOSIS — Z23 Encounter for immunization: Secondary | ICD-10-CM

## 2019-08-18 NOTE — Progress Notes (Signed)
   Covid-19 Vaccination Clinic  Name:  [name]    MRN: [id number] DOB: 20-Dec-1965  08/18/2019  [name] was observed post Covid-19 immunization for 15 minutes without incident. She was provided with Vaccine Information Sheet and instruction to access the V-Safe system.   [name] was instructed to call 911 with any severe reactions post vaccine: Marland Kitchen Difficulty breathing  . Swelling of face and throat  . A fast heartbeat  . A bad rash all over body  . Dizziness and weakness   Immunizations Administered    Name Date Dose VIS Date Route   Moderna COVID-19 Vaccine 08/18/2019  1:06 PM 0.5 mL 05/08/2019 Intramuscular   Manufacturer: Moderna   Lot: [id number]   [id number]

## 2019-08-27 ENCOUNTER — Ambulatory Visit: Payer: BC Managed Care – PPO

## 2019-08-27 ENCOUNTER — Other Ambulatory Visit: Payer: Self-pay

## 2019-08-27 ENCOUNTER — Ambulatory Visit (INDEPENDENT_AMBULATORY_CARE_PROVIDER_SITE_OTHER): Payer: BC Managed Care – PPO | Admitting: Orthopedic Surgery

## 2019-08-27 VITALS — BP 120/77 | HR 81 | Temp 98.1°F | Ht 65.0 in | Wt 208.0 lb

## 2019-08-27 DIAGNOSIS — G5732 Lesion of lateral popliteal nerve, left lower limb: Secondary | ICD-10-CM

## 2019-08-27 DIAGNOSIS — M659 Synovitis and tenosynovitis, unspecified: Secondary | ICD-10-CM

## 2019-08-27 DIAGNOSIS — M25572 Pain in left ankle and joints of left foot: Secondary | ICD-10-CM

## 2019-08-27 DIAGNOSIS — M76822 Posterior tibial tendinitis, left leg: Secondary | ICD-10-CM

## 2019-08-27 DIAGNOSIS — M25362 Other instability, left knee: Secondary | ICD-10-CM

## 2019-08-27 MED ORDER — PREDNISONE 10 MG PO TABS: 10.0000 mg | ORAL_TABLET | Freq: Three times a day (TID) | ORAL | 1 refills | Status: DC

## 2019-08-27 NOTE — Patient Instructions (Addendum)
Use cane in right hand Go for therapy They will call with appointment Take prednisone   #1 posterior tibial tendon dysfunction  2.  Inflammation of the ankle joint  3.  Dysfunction of the nerve across the top of the foot

## 2019-08-27 NOTE — Progress Notes (Signed)
Chief Complaint  Patient presents with  . Ankle Pain    Recheck on left ankle, DOI 03-21-19.    54 year old female had a fracture of the fibula in October healed the fracture with nonoperative treatment comes in today complaining of cramping limping, she says it does not feel right, she feels a lot of pulling, she has altered sensation on the dorsum of the foot and pain on the medial and anterior portion of the ankle  Review of systems she denies numbness or tingling denies any fever  Past Medical History:  Diagnosis Date  . Anemia     Examination BP 120/77   Pulse 81   Temp 98.1 F (36.7 C)   Ht 5\' 5"  (1.651 m)   Wt 208 lb (94.3 kg)   LMP 08/18/2018   BMI 34.61 kg/m   Left ankle it is warm to touch the ankle joint is swollen she has altered sensation to touch on the dorsum of the foot she has tenderness over the posterior tibial tendon on the back of the tibia and inferior to the malleolus.  The fibula fracture itself is nontender the peroneal nerves are intact she has painful range of motion in dorsiflexion and plantarflexion with decreased range of motion in both compared to the right she has a stable drawer test  Her tiptoe test single-leg shows weakness of the posterior tibial tendons  Encounter Diagnoses  Name Primary?  . Pain in left ankle and joints of left foot Yes  . Instability of left knee joint   . Posterior tibial tendon dysfunction (PTTD) of left lower extremity   . Synovitis of left ankle   . Neuropathy of left superficial peroneal nerve    Start physical therapy  I asked her to use a cane in the right hand  Prednisone 10 mg 3 times daily for 30 days  Follow-up 4 weeks  Acute complicated multiple systems involved, prescription

## 2019-09-03 ENCOUNTER — Other Ambulatory Visit: Payer: Self-pay

## 2019-09-03 ENCOUNTER — Ambulatory Visit: Payer: BC Managed Care – PPO | Admitting: Physical Therapy

## 2019-09-06 ENCOUNTER — Encounter: Payer: Self-pay | Admitting: Radiology

## 2019-09-06 NOTE — Progress Notes (Unsigned)
Patient called yesterday to inquire on her insurance plan and physical therapy.  She had s/w [facility] PT and they said that they advised her that she would not be covered to go there.  She actually s/w insurance company and they sent her a list of participating PT locations.  She will email this to me and I will refer her to one of those. She has the [employer], but her ins card does not denote this.

## 2019-09-19 ENCOUNTER — Ambulatory Visit: Payer: BC Managed Care – PPO | Attending: Internal Medicine

## 2019-09-19 DIAGNOSIS — Z23 Encounter for immunization: Secondary | ICD-10-CM

## 2019-09-19 NOTE — Progress Notes (Signed)
   Covid-19 Vaccination Clinic  Name:  [name]    MRN: [id number] DOB: [birth year]  09/19/2019  [name] was observed post Covid-19 immunization for 15 minutes without incident. She was provided with Vaccine Information Sheet and instruction to access the V-Safe system.   [name] was instructed to call 911 with any severe reactions post vaccine: Marland Kitchen Difficulty breathing  . Swelling of face and throat  . A fast heartbeat  . A bad rash all over body  . Dizziness and weakness   Immunizations Administered    Name Date Dose VIS Date Route   Moderna COVID-19 Vaccine 09/19/2019 12:06 PM 0.5 mL 05/08/2019 Intramuscular   Manufacturer: Moderna   Lot: [id number]   [id number]

## 2019-09-24 ENCOUNTER — Other Ambulatory Visit: Payer: Self-pay

## 2019-09-24 ENCOUNTER — Ambulatory Visit (INDEPENDENT_AMBULATORY_CARE_PROVIDER_SITE_OTHER): Payer: BC Managed Care – PPO | Admitting: Orthopedic Surgery

## 2019-09-24 VITALS — Temp 97.0°F | Ht 65.0 in | Wt 208.0 lb

## 2019-09-24 DIAGNOSIS — M659 Synovitis and tenosynovitis, unspecified: Secondary | ICD-10-CM

## 2019-09-24 DIAGNOSIS — G5732 Lesion of lateral popliteal nerve, left lower limb: Secondary | ICD-10-CM

## 2019-09-24 DIAGNOSIS — M25572 Pain in left ankle and joints of left foot: Secondary | ICD-10-CM

## 2019-09-24 DIAGNOSIS — S8265XD Nondisplaced fracture of lateral malleolus of left fibula, subsequent encounter for closed fracture with routine healing: Secondary | ICD-10-CM

## 2019-09-24 NOTE — Progress Notes (Signed)
No chief complaint on file.  Previous office visit Chief Complaint  Patient presents with  . Ankle Pain      Recheck on left ankle, DOI 03-21-19.      Encounter Diagnoses  Name Primary?  . Closed nondisplaced fracture of lateral malleolus of left fibula with routine healing, subsequent encounter 03/21/19 Yes  . Pain in left ankle and joints of left foot   . Synovitis of left ankle   . Neuropathy of left superficial peroneal nerve     54 year old female had a fracture of the fibula in October healed the fracture with nonoperative treatment comes in today complaining of cramping limping, she says it does not feel right, she feels a lot of pulling, she has altered sensation on the dorsum of the foot and pain on the medial and anterior portion of the ankle   Review of systems she denies numbness or tingling denies any fever _______________________________________________________________  Current office visit  [name] was not able to go to physical therapy.  The clear pricing policy alert indicated she was not improved at the therapy place that she was going to go to so she has been walking and massaging her foot and she is getting better.  She still has some stiffness after sitting or when she first gets up and she has an occasional but not constant shocklike sensation that goes across the dorsum of the foot  So she seems to be getting better  She is able to walk on a paved surface for exercise  Temp (!) 97 F (36.1 C)   Ht 5\' 5"  (1.651 m)   Wt 208 lb (94.3 kg)   LMP 08/18/2018   BMI 34.61 kg/m   Left ankle and foot  No longer has any swelling she is regained full range of motion she still has tenderness around the Achilles but the tightness seems to have improved  The sensory exam shows decreased sensation in the toes but not as bad as before she can move her toes and MTP joints well  Color and capillary refill normal   Encounter Diagnoses  Name Primary?  . Closed  nondisplaced fracture of lateral malleolus of left fibula with routine healing, subsequent encounter 03/21/19 Yes  . Pain in left ankle and joints of left foot   . Synovitis of left ankle   . Neuropathy of left superficial peroneal nerve     Recommend continue walking for exercise  Add ankle sleeve  Continue massage foot and ankle  Return in 6 weeks

## 2019-09-24 NOTE — Patient Instructions (Signed)
Continue with the walking it is okay to add an ankle sleeve and massage the foot and ankle as well

## 2019-11-12 ENCOUNTER — Encounter: Payer: Self-pay | Admitting: Orthopedic Surgery

## 2019-11-12 ENCOUNTER — Ambulatory Visit: Payer: BC Managed Care – PPO | Admitting: Orthopedic Surgery

## 2020-04-16 ENCOUNTER — Other Ambulatory Visit: Payer: Self-pay

## 2020-04-16 ENCOUNTER — Encounter (HOSPITAL_COMMUNITY): Payer: Self-pay | Admitting: Emergency Medicine

## 2020-04-16 ENCOUNTER — Emergency Department (HOSPITAL_COMMUNITY)
Admission: EM | Admit: 2020-04-16 | Discharge: 2020-04-16 | Disposition: A | Discharge status: Home / Self Care | Payer: BC Managed Care – PPO | Source: Home / Self Care | Attending: Emergency Medicine | Admitting: Emergency Medicine

## 2020-04-16 ENCOUNTER — Emergency Department (HOSPITAL_COMMUNITY)
Admission: EM | Admit: 2020-04-16 | Discharge: 2020-04-17 | Disposition: A | Discharge status: Home / Self Care | Payer: BC Managed Care – PPO | Attending: Emergency Medicine | Admitting: Emergency Medicine

## 2020-04-16 DIAGNOSIS — M62830 Muscle spasm of back: Secondary | ICD-10-CM | POA: Insufficient documentation

## 2020-04-16 DIAGNOSIS — R1012 Left upper quadrant pain: Secondary | ICD-10-CM | POA: Insufficient documentation

## 2020-04-16 DIAGNOSIS — R109 Unspecified abdominal pain: Secondary | ICD-10-CM

## 2020-04-16 DIAGNOSIS — D259 Leiomyoma of uterus, unspecified: Secondary | ICD-10-CM | POA: Insufficient documentation

## 2020-04-16 MED ORDER — CYCLOBENZAPRINE HCL 10 MG PO TABS
10.0000 mg | ORAL_TABLET | Freq: Once | ORAL | Status: AC
  Administered 2020-04-16: 10 mg via ORAL
  Filled 2020-04-16: qty 1

## 2020-04-16 MED ORDER — LIDOCAINE 5 % EX PTCH: 1.0000 | MEDICATED_PATCH | CUTANEOUS | 0 refills | Status: DC

## 2020-04-16 MED ORDER — MELOXICAM 7.5 MG PO TABS: 7.5000 mg | ORAL_TABLET | Freq: Every day | ORAL | 0 refills | Status: AC

## 2020-04-16 MED ORDER — METHOCARBAMOL 500 MG PO TABS: 500.0000 mg | ORAL_TABLET | Freq: Two times a day (BID) | ORAL | 0 refills | Status: DC

## 2020-04-16 MED ORDER — KETOROLAC TROMETHAMINE 60 MG/2ML IM SOLN
30.0000 mg | Freq: Once | INTRAMUSCULAR | Status: AC
  Administered 2020-04-16: 30 mg via INTRAMUSCULAR
  Filled 2020-04-16: qty 2

## 2020-04-16 MED ORDER — LIDOCAINE 5 % EX PTCH
2.0000 | MEDICATED_PATCH | CUTANEOUS | Status: DC
  Administered 2020-04-16: 2 via TRANSDERMAL
  Filled 2020-04-16: qty 2

## 2020-04-16 NOTE — ED Provider Notes (Signed)
[facility] EMERGENCY DEPARTMENT Provider Note   CSN: [id number] Arrival date & time: 04/16/20  1033     History Chief Complaint  Patient presents with  . Flank Pain    [name] is a 54 y.o. female.  54 year old female presents with complaint of pain in her left side back.  Patient states pain started about a week ago, progressively worsening.  Pain is worse with any kind of movement, bending, twisting.  Denies any falls or injuries, nausea, vomiting, abdominal pain, chest pain, radicular pain.  Patient has not taken anything for her pain.  No other complaints or concerns.        Past Medical History:  Diagnosis Date  . Anemia     Patient Active Problem List   Diagnosis Date Noted  . Closed nondisplaced fracture of lateral malleolus of left fibula 03/21/2019 04/05/2019    Past Surgical History:  Procedure Laterality Date  . BACK SURGERY    . TONSILLECTOMY       OB History    Gravida  3   Para  2   Term  2   Preterm      AB  1   Living  2     SAB  1   TAB      Ectopic      Multiple      Live Births  2           Family History  Problem Relation Age of Onset  . Diabetes Maternal Grandmother   . Other Maternal Grandfather        brain tumor  . Diabetes Father   . Hypertension Father   . Diabetes Mother   . Diabetes Sister   . Diabetes Sister   . Asthma Daughter     Social History   Tobacco Use  . Smoking status: Never Smoker  . Smokeless tobacco: Never Used  Vaping Use  . Vaping Use: Never used  Substance Use Topics  . Alcohol use: No  . Drug use: No    Home Medications Prior to Admission medications   Medication Sig Start Date End Date Taking? Authorizing Provider  lidocaine (LIDODERM) 5 % Place 1 patch onto the skin daily. Remove & Discard patch within 12 hours or as directed by MD 04/16/20   Tacy Learn, PA-C  megestrol (MEGACE) 40 MG tablet 3 tablets a day for 5 days, 2 tablets a day for 5 days then 1 tablet  daily 08/28/18   Florian Buff, MD  meloxicam (MOBIC) 7.5 MG tablet Take 1 tablet (7.5 mg total) by mouth daily for 10 days. 04/16/20 04/26/20  Tacy Learn, PA-C  methocarbamol (ROBAXIN) 500 MG tablet Take 1 tablet (500 mg total) by mouth 2 (two) times daily. 04/16/20   Tacy Learn, PA-C  Multiple Vitamin (MULTIVITAMIN) tablet Take 1 tablet by mouth. Takes once or twice a week    [provider]  predniSONE (DELTASONE) 10 MG tablet Take 1 tablet (10 mg total) by mouth 3 (three) times daily. 08/27/19   Carole Civil, MD    Allergies    Patient has no known allergies.  Review of Systems   Review of Systems  Constitutional: Negative for chills and fever.  Respiratory: Negative for shortness of breath.   Cardiovascular: Negative for chest pain.  Gastrointestinal: Negative for abdominal pain, nausea and vomiting.  Genitourinary: Negative for dysuria.  Musculoskeletal: Positive for back pain and myalgias. Negative for neck pain and  neck stiffness.  Skin: Negative for rash and wound.  Allergic/Immunologic: Negative for immunocompromised state.  Neurological: Negative for weakness and numbness.  Psychiatric/Behavioral: Negative for confusion.  All other systems reviewed and are negative.   Physical Exam Updated Vital Signs BP 116/85 (BP Location: Right Arm)   Pulse (!) 59   Temp (!) 97.5 F (36.4 C) (Oral)   Resp 18   Ht 5\' 5"  (1.651 m)   Wt 93.4 kg   LMP 08/18/2018   SpO2 100%   BMI 34.28 kg/m   Physical Exam Vitals and nursing note reviewed.  Constitutional:      General: She is not in acute distress.    Appearance: She is well-developed. She is not diaphoretic.  HENT:     Head: Normocephalic and atraumatic.  Cardiovascular:     Rate and Rhythm: Normal rate and regular rhythm.     Pulses: Normal pulses.     Heart sounds: Normal heart sounds.  Pulmonary:     Effort: Pulmonary effort is normal.     Breath sounds: Normal breath sounds.  Abdominal:      Palpations: Abdomen is soft.     Tenderness: There is no abdominal tenderness.  Musculoskeletal:        General: Tenderness present. No swelling or deformity.     Cervical back: No bony tenderness.     Thoracic back: No bony tenderness.     Lumbar back: No bony tenderness.       Back:     Right lower leg: No edema.     Left lower leg: No edema.     Comments: Diffuse left side back pain, no skin changes, no midline or bony tenderness.  Extremity strength symmetric.  Skin:    General: Skin is warm and dry.     Findings: No bruising, erythema or rash.  Neurological:     Mental Status: She is alert and oriented to person, place, and time.  Psychiatric:        Behavior: Behavior normal.     ED Results / Procedures / Treatments   Labs (all labs ordered are listed, but only abnormal results are displayed) Labs Reviewed - No data to display  EKG None  Radiology No results found.  Procedures Procedures (including critical care time)  Medications Ordered in ED Medications  ketorolac (TORADOL) injection 30 mg (has no administration in time range)  cyclobenzaprine (FLEXERIL) tablet 10 mg (has no administration in time range)  lidocaine (LIDODERM) 5 % 2 patch (has no administration in time range)    ED Course  I have reviewed the triage vital signs and the nursing notes.  Pertinent labs & imaging results that were available during my care of the patient were reviewed by me and considered in my medical decision making (see chart for details).  Clinical Course as of Apr 16 1133  Wed Apr 16, 5629  2522 54 year old female with left side back pain without injury, on exam found to have tenderness to her entire left side back, no midline or bony tenderness.  Extremity strength symmetric, pulses equal, sensation intact.  Plan is to treat with IM Toradol and p.o. Flexeril in the ER as well as a Lidoderm patch.  Will discharge with muscle relaxer, NSAIDs Lidoderm patches.  Recommend warm  compresses followed by gentle stretching and follow-up with PCP.   [LM]    Clinical Course User Index [LM] Roque Lias   MDM Rules/Calculators/A&P  Final Clinical Impression(s) / ED Diagnoses Final diagnoses:  Muscle spasm of back    Rx / DC Orders ED Discharge Orders         Ordered    methocarbamol (ROBAXIN) 500 MG tablet  2 times daily        04/16/20 1133    meloxicam (MOBIC) 7.5 MG tablet  Daily        04/16/20 1133    lidocaine (LIDODERM) 5 %  Every 24 hours        04/16/20 1134           Roque Lias 04/16/20 1134    Carmin Muskrat, MD 04/18/20 0800

## 2020-04-16 NOTE — ED Triage Notes (Signed)
Pt here earlier for left flank pain. States she isn't feeling better.

## 2020-04-16 NOTE — Discharge Instructions (Addendum)
Take Robaxin and meloxicam as needed as prescribed.  Apply Lidoderm patch as discussed. Warm compresses to sore muscles for 20 to time followed by gentle stretching as discussed.  Follow-up with your PCP.

## 2020-04-16 NOTE — ED Triage Notes (Signed)
Pt c/o of left flank pain. Denies urinary s/s or injury

## 2020-04-17 ENCOUNTER — Emergency Department (HOSPITAL_COMMUNITY): Payer: BC Managed Care – PPO

## 2020-04-17 LAB — URINALYSIS, ROUTINE W REFLEX MICROSCOPIC
Bilirubin Urine: NEGATIVE
Glucose, UA: NEGATIVE mg/dL
Hgb urine dipstick: NEGATIVE
Ketones, ur: NEGATIVE mg/dL
Leukocytes,Ua: NEGATIVE
Nitrite: NEGATIVE
Protein, ur: NEGATIVE mg/dL
Specific Gravity, Urine: 1.03 (ref 1.005–1.030)
pH: 5 (ref 5.0–8.0)

## 2020-04-17 LAB — CBC WITH DIFFERENTIAL/PLATELET
Abs Immature Granulocytes: 0.01 10*3/uL (ref 0.00–0.07)
Basophils Absolute: 0 10*3/uL (ref 0.0–0.1)
Basophils Relative: 1 %
Eosinophils Absolute: 0.1 10*3/uL (ref 0.0–0.5)
Eosinophils Relative: 2 %
HCT: 39.7 % (ref 36.0–46.0)
Hemoglobin: 12.8 g/dL (ref 12.0–15.0)
Immature Granulocytes: 0 %
Lymphocytes Relative: 42 %
Lymphs Abs: 2.1 10*3/uL (ref 0.7–4.0)
MCH: 30.4 pg (ref 26.0–34.0)
MCHC: 32.2 g/dL (ref 30.0–36.0)
MCV: 94.3 fL (ref 80.0–100.0)
Monocytes Absolute: 0.4 10*3/uL (ref 0.1–1.0)
Monocytes Relative: 7 %
Neutro Abs: 2.4 10*3/uL (ref 1.7–7.7)
Neutrophils Relative %: 48 %
Platelets: 256 10*3/uL (ref 150–400)
RBC: 4.21 MIL/uL (ref 3.87–5.11)
RDW: 13.5 % (ref 11.5–15.5)
WBC: 4.9 10*3/uL (ref 4.0–10.5)
nRBC: 0 % (ref 0.0–0.2)

## 2020-04-17 LAB — COMPREHENSIVE METABOLIC PANEL
ALT: 17 U/L (ref 0–44)
AST: 14 U/L — ABNORMAL LOW (ref 15–41)
Albumin: 3.4 g/dL — ABNORMAL LOW (ref 3.5–5.0)
Alkaline Phosphatase: 110 U/L (ref 38–126)
Anion gap: 7 (ref 5–15)
BUN: 20 mg/dL (ref 6–20)
CO2: 27 mmol/L (ref 22–32)
Calcium: 8.7 mg/dL — ABNORMAL LOW (ref 8.9–10.3)
Chloride: 107 mmol/L (ref 98–111)
Creatinine, Ser: 0.84 mg/dL (ref 0.44–1.00)
GFR, Estimated: >60 mL/min (ref >60)
Glucose, Bld: 99 mg/dL (ref 70–99)
Potassium: 3.9 mmol/L (ref 3.5–5.1)
Sodium: 141 mmol/L (ref 135–145)
Total Bilirubin: 0.4 mg/dL (ref 0.3–1.2)
Total Protein: 6.7 g/dL (ref 6.5–8.1)

## 2020-04-17 LAB — LIPASE, BLOOD: Lipase: 37 U/L (ref 11–51)

## 2020-04-17 MED ORDER — FENTANYL CITRATE (PF) 100 MCG/2ML IJ SOLN
100.0000 ug | Freq: Once | INTRAMUSCULAR | Status: AC
  Administered 2020-04-17: 100 ug via INTRAVENOUS
  Filled 2020-04-17: qty 2

## 2020-04-17 MED ORDER — IOHEXOL 300 MG/ML  SOLN
100.0000 mL | Freq: Once | INTRAMUSCULAR | Status: AC | PRN
  Administered 2020-04-17: 100 mL via INTRAVENOUS

## 2020-04-17 MED ORDER — KETOROLAC TROMETHAMINE 30 MG/ML IJ SOLN
30.0000 mg | Freq: Once | INTRAMUSCULAR | Status: AC
  Administered 2020-04-17: 30 mg via INTRAVENOUS
  Filled 2020-04-17: qty 1

## 2020-04-17 NOTE — ED Provider Notes (Signed)
[facility] EMERGENCY DEPARTMENT Provider Note   CSN: [id number] Arrival date & time: 04/16/20  1851     History Chief Complaint  Patient presents with  . Flank Pain    [name] is a 54 y.o. female.  The history is provided by the patient.  Flank Pain This is a new problem. The current episode started more than 2 days ago. The problem occurs daily. The problem has been gradually worsening. Associated symptoms include chest pain, abdominal pain and shortness of breath. Exacerbated by: Movement and palpation. Nothing relieves the symptoms.  Patient presents for repeat ER visit.  Patient was seen on the morning of November 10 for left flank pain and was diagnosed with muscle strain/spasm.  She was placed on Lidoderm patches, Mobic and Robaxin.  She reports her pain is worsening.  Denies any trauma.  This has been ongoing for at least 4 days.  Reports it starts in her left side and radiates to her left upper abdomen.  She also reports some chest wall pain and feeling short of breath.  She reports feeling unwell and having dizziness at times.  She reports recent loss of her father and has had sleep disruption.  No fevers or vomiting. No new weakness in her legs.  No dysuria.     Past Medical History:  Diagnosis Date  . Anemia     Patient Active Problem List   Diagnosis Date Noted  . Closed nondisplaced fracture of lateral malleolus of left fibula 03/21/2019 04/05/2019    Past Surgical History:  Procedure Laterality Date  . BACK SURGERY    . TONSILLECTOMY       OB History    Gravida  3   Para  2   Term  2   Preterm      AB  1   Living  2     SAB  1   TAB      Ectopic      Multiple      Live Births  2           Family History  Problem Relation Age of Onset  . Diabetes Maternal Grandmother   . Other Maternal Grandfather        brain tumor  . Diabetes Father   . Hypertension Father   . Diabetes Mother   . Diabetes Sister   . Diabetes Sister     . Asthma Daughter     Social History   Tobacco Use  . Smoking status: Never Smoker  . Smokeless tobacco: Never Used  Vaping Use  . Vaping Use: Never used  Substance Use Topics  . Alcohol use: No  . Drug use: No    Home Medications Prior to Admission medications   Medication Sig Start Date End Date Taking? Authorizing Provider  lidocaine (LIDODERM) 5 % Place 1 patch onto the skin daily. Remove & Discard patch within 12 hours or as directed by MD 04/16/20   Tacy Learn, PA-C  megestrol (MEGACE) 40 MG tablet 3 tablets a day for 5 days, 2 tablets a day for 5 days then 1 tablet daily 08/28/18   Florian Buff, MD  meloxicam (MOBIC) 7.5 MG tablet Take 1 tablet (7.5 mg total) by mouth daily for 10 days. 04/16/20 04/26/20  Tacy Learn, PA-C  methocarbamol (ROBAXIN) 500 MG tablet Take 1 tablet (500 mg total) by mouth 2 (two) times daily. 04/16/20   Tacy Learn, PA-C  Multiple Vitamin (MULTIVITAMIN)  tablet Take 1 tablet by mouth. Takes once or twice a week    [provider]  predniSONE (DELTASONE) 10 MG tablet Take 1 tablet (10 mg total) by mouth 3 (three) times daily. 08/27/19   Carole Civil, MD    Allergies    Patient has no known allergies.  Review of Systems   Review of Systems  Constitutional: Positive for fatigue. Negative for fever.  Respiratory: Positive for shortness of breath.   Cardiovascular: Positive for chest pain.  Gastrointestinal: Positive for abdominal pain. Negative for vomiting.  Genitourinary: Positive for flank pain. Negative for dysuria.  Neurological: Positive for dizziness.  All other systems reviewed and are negative.   Physical Exam Updated Vital Signs BP 114/72 (BP Location: Right Arm)   Pulse 67   Temp 97.6 F (36.4 C) (Oral)   Resp 20   Ht 1.651 m (5\' 5" )   Wt 93.4 kg   LMP 08/18/2018   SpO2 98%   BMI 34.28 kg/m   Physical Exam CONSTITUTIONAL: Well developed/well nourished HEAD: Normocephalic/atraumatic EYES:  EOMI/PERRL ENMT: Mucous membranes moist NECK: supple no meningeal signs SPINE/BACK:entire spine nontender  CV: S1/S2 noted, no murmurs/rubs/gallops noted LUNGS: Lungs are clear to auscultation bilaterally, no apparent distress Chest-female chaperone present.  No rash noted.  Tenderness noted to left lower costal margin without bruising or crepitus ABDOMEN: soft, moderate LUQ tenderness, no rebound or guarding, +BS GU:no cva tenderness, no rash, no hematoma NEURO: Awake/alert,  equal motor 5/5 strength noted with the following: hip flexion/knee flexion/extension, foot dorsi/plantar flexion, great toe extension intact bilaterally,no sensory deficit in any dermatome.   EXTREMITIES: pulses normal, full ROM SKIN: warm, color normal PSYCH: no abnormalities of mood noted, alert and oriented to situation   ED Results / Procedures / Treatments   Labs (all labs ordered are listed, but only abnormal results are displayed) Labs Reviewed  URINALYSIS, ROUTINE W REFLEX MICROSCOPIC - Abnormal; Notable for the following components:      Result Value   APPearance HAZY (*)    All other components within normal limits  COMPREHENSIVE METABOLIC PANEL - Abnormal; Notable for the following components:   Calcium 8.7 (*)    Albumin 3.4 (*)    AST 14 (*)    All other components within normal limits  CBC WITH DIFFERENTIAL/PLATELET  LIPASE, BLOOD    EKG  ED ECG REPORT   Date: 04/17/2020 0240  Rate: 58  Rhythm: normal sinus rhythm  QRS Axis: normal  Intervals: normal  ST/T Wave abnormalities: normal  Conduction Disutrbances:none     I have personally reviewed the EKG tracing and agree with the computerized printout as noted.  Radiology CT ABDOMEN PELVIS W CONTRAST  Result Date: 04/17/2020 CLINICAL DATA:  Left-sided pain. EXAM: CT ABDOMEN AND PELVIS WITH CONTRAST TECHNIQUE: Multidetector CT imaging of the abdomen and pelvis was performed using the standard protocol following bolus administration  of intravenous contrast. CONTRAST:  131mL OMNIPAQUE IOHEXOL 300 MG/ML  SOLN COMPARISON:  CT dated August 18, 2018 FINDINGS: Lower chest: The lung bases are clear. The heart size is normal. Hepatobiliary: The liver is normal. Normal gallbladder.There is no biliary ductal dilation. Pancreas: Normal contours without ductal dilatation. No peripancreatic fluid collection. Spleen: Unremarkable. Adrenals/Urinary Tract: --Adrenal glands: Unremarkable. --Right kidney/ureter: No hydronephrosis or radiopaque kidney stones. --Left kidney/ureter: No hydronephrosis or radiopaque kidney stones. --Urinary bladder: Unremarkable. Stomach/Bowel: --Stomach/Duodenum: No hiatal hernia or other gastric abnormality. Normal duodenal course and caliber. --Small bowel: Unremarkable. --Colon: Unremarkable. --Appendix: Normal. Vascular/Lymphatic: Normal  course and caliber of the major abdominal vessels. --No retroperitoneal lymphadenopathy. --No mesenteric lymphadenopathy. --No pelvic or inguinal lymphadenopathy. Reproductive: Again noted is a uterine fibroid. Other: No ascites or free air. The abdominal wall is normal. Musculoskeletal. No acute displaced fractures. IMPRESSION: 1. No acute abdominopelvic abnormality. 2. Fibroid uterus. Electronically Signed   By: Constance Holster M.D.   On: 04/17/2020 03:20   DG Chest Port 1 View  Result Date: 04/17/2020 CLINICAL DATA:  Chest pain EXAM: PORTABLE CHEST 1 VIEW COMPARISON:  02/26/2019 FINDINGS: The heart size and mediastinal contours are within normal limits. Both lungs are clear. The visualized skeletal structures are unremarkable. IMPRESSION: No active disease. Electronically Signed   By: Ulyses Jarred M.D.   On: 04/17/2020 02:34    Procedures Procedures   Medications Ordered in ED Medications  fentaNYL (SUBLIMAZE) injection 100 mcg (100 mcg Intravenous Given 04/17/20 0206)  iohexol (OMNIPAQUE) 300 MG/ML solution 100 mL (100 mLs Intravenous Contrast Given 04/17/20 0258)  ketorolac  (TORADOL) 30 MG/ML injection 30 mg (30 mg Intravenous Given 04/17/20 0358)    ED Course  I have reviewed the triage vital signs and the nursing notes.  Pertinent labs & imaging results that were available during my care of the patient were reviewed by me and considered in my medical decision making (see chart for details).    MDM Rules/Calculators/A&P                          2:17 AM Patient presents for repeat evaluation after being diagnosed muscle spasm.  Medications that were prescribed not improve her symptoms. She now has pain in her left upper quadrant as well as left lower costal margin.  Will obtain labs and chest x-ray.  Patient may require CT imaging of her abdomen/pelvis 2:45 AM Initial work-up negative.  Will proceed with CT abdomen/pelvis 4:32 AM CT scan negative Pt feeling improved Will d/c home with PCP followup Pt agreeable with plan Final Clinical Impression(s) / ED Diagnoses Final diagnoses:  Left flank pain  LUQ pain  Uterine leiomyoma, unspecified location    Rx / DC Orders ED Discharge Orders    None       Ripley Fraise, MD 04/17/20 [phone]

## 2020-05-06 ENCOUNTER — Other Ambulatory Visit: Payer: Self-pay

## 2020-05-06 ENCOUNTER — Emergency Department (HOSPITAL_COMMUNITY)
Admission: EM | Admit: 2020-05-06 | Discharge: 2020-05-07 | Disposition: A | Discharge status: Home / Self Care | Payer: BC Managed Care – PPO | Attending: Emergency Medicine | Admitting: Emergency Medicine

## 2020-05-06 ENCOUNTER — Emergency Department (HOSPITAL_COMMUNITY): Payer: BC Managed Care – PPO

## 2020-05-06 ENCOUNTER — Encounter (HOSPITAL_COMMUNITY): Payer: Self-pay | Admitting: Emergency Medicine

## 2020-05-06 DIAGNOSIS — R079 Chest pain, unspecified: Secondary | ICD-10-CM | POA: Insufficient documentation

## 2020-05-06 LAB — BASIC METABOLIC PANEL
Anion gap: 8 (ref 5–15)
BUN: 15 mg/dL (ref 6–20)
CO2: 27 mmol/L (ref 22–32)
Calcium: 9 mg/dL (ref 8.9–10.3)
Chloride: 105 mmol/L (ref 98–111)
Creatinine, Ser: 0.7 mg/dL (ref 0.44–1.00)
GFR, Estimated: >60 mL/min (ref >60)
Glucose, Bld: 89 mg/dL (ref 70–99)
Potassium: 3.7 mmol/L (ref 3.5–5.1)
Sodium: 140 mmol/L (ref 135–145)

## 2020-05-06 LAB — CBC
HCT: 43.3 % (ref 36.0–46.0)
Hemoglobin: 14.1 g/dL (ref 12.0–15.0)
MCH: 30.3 pg (ref 26.0–34.0)
MCHC: 32.6 g/dL (ref 30.0–36.0)
MCV: 93.1 fL (ref 80.0–100.0)
Platelets: 256 10*3/uL (ref 150–400)
RBC: 4.65 MIL/uL (ref 3.87–5.11)
RDW: 13.3 % (ref 11.5–15.5)
WBC: 5.7 10*3/uL (ref 4.0–10.5)
nRBC: 0 % (ref 0.0–0.2)

## 2020-05-06 LAB — TROPONIN I (HIGH SENSITIVITY): Troponin I (High Sensitivity): <2 ng/L (ref <18)

## 2020-05-06 LAB — LIPASE, BLOOD: Lipase: 26 U/L (ref 11–51)

## 2020-05-06 MED ORDER — IOHEXOL 350 MG/ML SOLN
100.0000 mL | Freq: Once | INTRAVENOUS | Status: AC | PRN
  Administered 2020-05-06: 100 mL via INTRAVENOUS

## 2020-05-06 NOTE — ED Provider Notes (Signed)
[facility] Emergency Department Provider Note MRN:  [id number]  Arrival date & time: 05/06/20     Chief Complaint   Chest Pain   History of Present Illness   [name] is a 54 y.o. year-old female with no pertinent past medical presenting to the ED with chief complaint of chest.  Location: Left-sided chest pain with radiation to the left thoracic back Duration: Three 4-hour Onset: Sudden Timing: Constant Description: Dull Severity: Moderate to severe Exacerbating/Alleviating Factors: Worse with deep breaths Associated Symptoms: Near syncope Pertinent Negatives: No diaphoresis, no nausea or vomiting, no trouble breathing, no abdominal pain, no numbness or weakness   Review of Systems  A complete 10 system review of systems was obtained and all systems are negative except as noted in the HPI and PMH.   Patient's Health History    Past Medical History:  Diagnosis Date  . Anemia     Past Surgical History:  Procedure Laterality Date  . BACK SURGERY    . TONSILLECTOMY      Family History  Problem Relation Age of Onset  . Diabetes Maternal Grandmother   . Other Maternal Grandfather        brain tumor  . Diabetes Father   . Hypertension Father   . Diabetes Mother   . Diabetes Sister   . Diabetes Sister   . Asthma Daughter     Social History   Socioeconomic History  . Marital status: Married    Spouse name: Not on file  . Number of children: Not on file  . Years of education: Not on file  . Highest education level: Not on file  Occupational History  . Not on file  Tobacco Use  . Smoking status: Never Smoker  . Smokeless tobacco: Never Used  Vaping Use  . Vaping Use: Never used  Substance and Sexual Activity  . Alcohol use: No  . Drug use: No  . Sexual activity: Yes    Birth control/protection: None  Other Topics Concern  . Not on file  Social History Narrative  . Not on file   Social Determinants of Health   Financial  Resource Strain:   . Difficulty of Paying Living Expenses: Not on file  Food Insecurity:   . Worried About Charity fundraiser in the Last Year: Not on file  . Ran Out of Food in the Last Year: Not on file  Transportation Needs:   . Lack of Transportation (Medical): Not on file  . Lack of Transportation (Non-Medical): Not on file  Physical Activity:   . Days of Exercise per Week: Not on file  . Minutes of Exercise per Session: Not on file  Stress:   . Feeling of Stress : Not on file  Social Connections:   . Frequency of Communication with Friends and Family: Not on file  . Frequency of Social Gatherings with Friends and Family: Not on file  . Attends Religious Services: Not on file  . Active Member of Clubs or Organizations: Not on file  . Attends Archivist Meetings: Not on file  . Marital Status: Not on file  Intimate Partner Violence:   . Fear of Current or Ex-Partner: Not on file  . Emotionally Abused: Not on file  . Physically Abused: Not on file  . Sexually Abused: Not on file     Physical Exam   Vitals:   05/06/20 2300 05/06/20 2330  BP:  98/68  Pulse: (!) 58 60  Resp: (!) 24 10  Temp:    SpO2: 100% 99%    CONSTITUTIONAL: Well-appearing, NAD NEURO:  Alert and oriented x 3, no focal deficits EYES:  eyes equal and reactive ENT/NECK:  no LAD, no JVD CARDIO: Regular rate, well-perfused, normal S1 and S2 PULM:  CTAB no wheezing or rhonchi GI/GU:  normal bowel sounds, non-distended, non-tender MSK/SPINE:  No gross deformities, no edema SKIN:  no rash, atraumatic PSYCH:  Appropriate speech and behavior  *Additional and/or pertinent findings included in MDM below  Diagnostic and Interventional Summary    EKG Interpretation  Date/Time:  Tuesday May 06 2020 18:28:44 EST Ventricular Rate:  72 PR Interval:  150 QRS Duration: 76 QT Interval:  380 QTC Calculation: 416 R Axis:   74 Text Interpretation: Normal sinus rhythm Normal ECG Confirmed by  Gerlene Fee [phone]) on 05/06/2020 11:04:00 PM      Labs Reviewed  BASIC METABOLIC PANEL  CBC  LIPASE, BLOOD  TROPONIN I (HIGH SENSITIVITY)  TROPONIN I (HIGH SENSITIVITY)    CT ANGIO CHEST PE W OR WO CONTRAST  Final Result    DG Chest 2 View  Final Result      Medications  iohexol (OMNIPAQUE) 350 MG/ML injection 100 mL (100 mLs Intravenous Contrast Given 05/06/20 2233)     Procedures  /  Critical Care Procedures  ED Course and Medical Decision Making  I have reviewed the triage vital signs, the nursing notes, and pertinent available records from the EMR.  Listed above are laboratory and imaging tests that I personally ordered, reviewed, and interpreted and then considered in my medical decision making (see below for details).  Chest pain with near syncope, pleuritic.  Concern for possible PE, less likely dissection.  CT pending.     CT is negative for PE.  Awaiting second troponin, if negative will be appropriate for discharge.  Signed out to oncoming provider shift change.  [name]. [name], MD [place] [email or web address]  Final Clinical Impressions(s) / ED Diagnoses     ICD-10-CM   1. Chest pain, unspecified type  R07.9     ED Discharge Orders    None       Discharge Instructions Discussed with and Provided to Patient:   Discharge Instructions   None       [name], MD 05/06/20 2344

## 2020-05-06 NOTE — ED Triage Notes (Signed)
Pt c/o chest pain that is described as tightness that radiates to her back. Pt states the pain started at 1800.

## 2020-05-07 LAB — TROPONIN I (HIGH SENSITIVITY): Troponin I (High Sensitivity): <2 ng/L (ref <18)

## 2020-05-07 NOTE — Discharge Instructions (Addendum)
You were evaluated in the Emergency Department and after careful evaluation, we did not find any emergent condition requiring admission or further testing in the hospital.  Your exam/testing today was overall reassuring.  Symptoms may be due to pleurisy or inflammation of the lining of the lung.  We recommend Tylenol 1000 mg every 4-6 hours and/or Motrin 600 mg every 4-6 hours for pain.  We also recommend follow-up with your primary care doctor to discuss the need for outpatient stress testing  Please return to the Emergency Department if you experience any worsening of your condition.  Thank you for allowing [place] to be a part of your care.

## 2020-12-07 ENCOUNTER — Emergency Department (HOSPITAL_COMMUNITY): Payer: BC Managed Care – PPO

## 2020-12-07 ENCOUNTER — Encounter (HOSPITAL_COMMUNITY): Payer: Self-pay

## 2020-12-07 ENCOUNTER — Emergency Department (HOSPITAL_COMMUNITY)
Admission: EM | Admit: 2020-12-07 | Discharge: 2020-12-07 | Disposition: A | Discharge status: Home / Self Care | Payer: BC Managed Care – PPO | Attending: Emergency Medicine | Admitting: Emergency Medicine

## 2020-12-07 DIAGNOSIS — R4701 Aphasia: Secondary | ICD-10-CM | POA: Insufficient documentation

## 2020-12-07 DIAGNOSIS — F449 Dissociative and conversion disorder, unspecified: Secondary | ICD-10-CM | POA: Insufficient documentation

## 2020-12-07 DIAGNOSIS — I10 Essential (primary) hypertension: Secondary | ICD-10-CM | POA: Insufficient documentation

## 2020-12-07 DIAGNOSIS — M79602 Pain in left arm: Secondary | ICD-10-CM | POA: Insufficient documentation

## 2020-12-07 DIAGNOSIS — R7309 Other abnormal glucose: Secondary | ICD-10-CM | POA: Insufficient documentation

## 2020-12-07 DIAGNOSIS — M6281 Muscle weakness (generalized): Secondary | ICD-10-CM | POA: Insufficient documentation

## 2020-12-07 DIAGNOSIS — R491 Aphonia: Secondary | ICD-10-CM

## 2020-12-07 DIAGNOSIS — R0602 Shortness of breath: Secondary | ICD-10-CM | POA: Diagnosis present

## 2020-12-07 LAB — DIFFERENTIAL
Abs Immature Granulocytes: 0.02 10*3/uL (ref 0.00–0.07)
Basophils Absolute: 0 10*3/uL (ref 0.0–0.1)
Basophils Relative: 0 %
Eosinophils Absolute: 0.1 10*3/uL (ref 0.0–0.5)
Eosinophils Relative: 3 %
Immature Granulocytes: 0 %
Lymphocytes Relative: 38 %
Lymphs Abs: 1.9 10*3/uL (ref 0.7–4.0)
Monocytes Absolute: 0.4 10*3/uL (ref 0.1–1.0)
Monocytes Relative: 8 %
Neutro Abs: 2.4 10*3/uL (ref 1.7–7.7)
Neutrophils Relative %: 51 %

## 2020-12-07 LAB — I-STAT BETA HCG BLOOD, ED (MC, WL, AP ONLY): I-stat hCG, quantitative: <5 m[IU]/mL (ref <5)

## 2020-12-07 LAB — COMPREHENSIVE METABOLIC PANEL
ALT: 18 U/L (ref 0–44)
AST: 21 U/L (ref 15–41)
Albumin: 3.6 g/dL (ref 3.5–5.0)
Alkaline Phosphatase: 92 U/L (ref 38–126)
Anion gap: 7 (ref 5–15)
BUN: 14 mg/dL (ref 6–20)
CO2: 24 mmol/L (ref 22–32)
Calcium: 9.3 mg/dL (ref 8.9–10.3)
Chloride: 108 mmol/L (ref 98–111)
Creatinine, Ser: 0.78 mg/dL (ref 0.44–1.00)
GFR, Estimated: >60 mL/min (ref >60)
Glucose, Bld: 81 mg/dL (ref 70–99)
Potassium: 4.2 mmol/L (ref 3.5–5.1)
Sodium: 139 mmol/L (ref 135–145)
Total Bilirubin: 0.7 mg/dL (ref 0.3–1.2)
Total Protein: 7.4 g/dL (ref 6.5–8.1)

## 2020-12-07 LAB — PROTIME-INR
INR: 1 (ref 0.8–1.2)
Prothrombin Time: 13.2 seconds (ref 11.4–15.2)

## 2020-12-07 LAB — I-STAT CHEM 8, ED
BUN: 15 mg/dL (ref 6–20)
Calcium, Ion: 1.15 mmol/L (ref 1.15–1.40)
Chloride: 107 mmol/L (ref 98–111)
Creatinine, Ser: 0.7 mg/dL (ref 0.44–1.00)
Glucose, Bld: 82 mg/dL (ref 70–99)
HCT: 43 % (ref 36.0–46.0)
Hemoglobin: 14.6 g/dL (ref 12.0–15.0)
Potassium: 4.2 mmol/L (ref 3.5–5.1)
Sodium: 142 mmol/L (ref 135–145)
TCO2: 26 mmol/L (ref 22–32)

## 2020-12-07 LAB — CBC
HCT: 42.7 % (ref 36.0–46.0)
Hemoglobin: 13.9 g/dL (ref 12.0–15.0)
MCH: 29.7 pg (ref 26.0–34.0)
MCHC: 32.6 g/dL (ref 30.0–36.0)
MCV: 91.2 fL (ref 80.0–100.0)
Platelets: 299 10*3/uL (ref 150–400)
RBC: 4.68 MIL/uL (ref 3.87–5.11)
RDW: 13.2 % (ref 11.5–15.5)
WBC: 4.9 10*3/uL (ref 4.0–10.5)
nRBC: 0 % (ref 0.0–0.2)

## 2020-12-07 LAB — APTT: aPTT: 27 seconds (ref 24–36)

## 2020-12-07 LAB — CBG MONITORING, ED: Glucose-Capillary: 78 mg/dL (ref 70–99)

## 2020-12-07 LAB — TROPONIN I (HIGH SENSITIVITY)
Troponin I (High Sensitivity): 3 ng/L (ref <18)
Troponin I (High Sensitivity): 3 ng/L (ref <18)

## 2020-12-07 MED ORDER — KETOROLAC TROMETHAMINE 30 MG/ML IJ SOLN
30.0000 mg | Freq: Once | INTRAMUSCULAR | Status: AC
  Administered 2020-12-07: 30 mg via INTRAVENOUS
  Filled 2020-12-07: qty 1

## 2020-12-07 MED ORDER — SODIUM CHLORIDE 0.9% FLUSH: 3.0000 mL | Freq: Once | INTRAVENOUS | Status: DC

## 2020-12-07 NOTE — ED Notes (Signed)
Family updated as to patient's status, husband is at the bedside.

## 2020-12-07 NOTE — ED Notes (Signed)
CBG 78. 

## 2020-12-07 NOTE — ED Notes (Signed)
Pt arrived to MRI with this RN at neurologist

## 2020-12-07 NOTE — ED Notes (Signed)
Patient was up and walking in the hallway. She did well and had zero issues, she is stable for discharge.

## 2020-12-07 NOTE — ED Provider Notes (Signed)
[facility] EMERGENCY DEPARTMENT Provider Note  CSN: [id number] Arrival date & time: 12/07/20 3710    History Chief Complaint  Patient presents with   Code Stroke     [name] is a 55 y.o. female brought to the ED via EMS as a Code Stroke. Per EMS, they were initially called for SOB and L arm pain. First responders were at the scene when she suddenly became unable so speak or move her L arm/leg at 0845. She was hypertensive, but otherwise stable enroute. Patient is unable to give any other history.   Past Medical History:  Diagnosis Date   Anemia     Past Surgical History:  Procedure Laterality Date   BACK SURGERY     TONSILLECTOMY      Family History  Problem Relation Age of Onset   Diabetes Maternal Grandmother    Other Maternal Grandfather        brain tumor   Diabetes Father    Hypertension Father    Diabetes Mother    Diabetes Sister    Diabetes Sister    Asthma Daughter     Social History   Tobacco Use   Smoking status: Never   Smokeless tobacco: Never  Vaping Use   Vaping Use: Never used  Substance Use Topics   Alcohol use: No   Drug use: No     Home Medications Prior to Admission medications   Medication Sig Start Date End Date Taking? Authorizing Provider  acetaminophen (TYLENOL) 500 MG tablet Take 500 mg by mouth every 6 (six) hours as needed for headache.   Yes [provider]  aspirin EC 81 MG tablet Take 81 mg by mouth every 4 (four) hours as needed for mild pain. Swallow whole.   Yes [provider]     Allergies    Patient has no known allergies.   Review of Systems   Review of Systems Unable to assess due to aphasia    Physical Exam BP 132/75 (BP Location: Right Arm)   Pulse (!) 55   Temp 98.1 F (36.7 C) (Oral)   Resp 15   LMP 08/18/2018   SpO2 100%   Physical Exam Vitals and nursing note reviewed.  Constitutional:      Appearance: Normal appearance.  HENT:     Head: Normocephalic and  atraumatic.     Nose: Nose normal.     Mouth/Throat:     Mouth: Mucous membranes are moist.  Eyes:     Extraocular Movements: Extraocular movements intact.     Conjunctiva/sclera: Conjunctivae normal.  Cardiovascular:     Rate and Rhythm: Normal rate.  Pulmonary:     Effort: Pulmonary effort is normal.     Breath sounds: Normal breath sounds.  Abdominal:     General: Abdomen is flat.     Palpations: Abdomen is soft.     Tenderness: There is no abdominal tenderness.  Musculoskeletal:        General: No swelling. Normal range of motion.     Cervical back: Neck supple.  Skin:    General: Skin is warm and dry.  Neurological:     Mental Status: She is alert.     Comments: No facial droop. Patient unable to speak or move her mouth/face. She is able to nod yes or now. Weakness of L arm and leg.   Psychiatric:        Mood and Affect: Mood normal.     ED Results /  Procedures / Treatments   Labs (all labs ordered are listed, but only abnormal results are displayed) Labs Reviewed  PROTIME-INR  APTT  CBC  DIFFERENTIAL  COMPREHENSIVE METABOLIC PANEL  I-STAT CHEM 8, ED  CBG MONITORING, ED  I-STAT BETA HCG BLOOD, ED ([facility], [facility], [facility] ONLY)  TROPONIN I (HIGH SENSITIVITY)  TROPONIN I (HIGH SENSITIVITY)    EKG EKG Interpretation  Date/Time:  Sunday December 07 2020 10:30:14 EDT Ventricular Rate:  61 PR Interval:  160 QRS Duration: 104 QT Interval:  393 QTC Calculation: 396 R Axis:   35 Text Interpretation: Sinus rhythm Normal ECG No significant change since last tracing Confirmed by Calvert Cantor [phone]) on 12/07/2020 10:38:33 AM  Radiology MR BRAIN WO CONTRAST  Result Date: 12/07/2020 CLINICAL DATA:  55 year old female code stroke presentation with left side deficits, last known well 0845 hours. EXAM: MRI HEAD WITHOUT CONTRAST TECHNIQUE: Multiplanar, multiecho pulse sequences of the brain and surrounding structures were obtained without intravenous contrast. COMPARISON:  Brain MRI  04/27/2009.  Plain head CT 0929 hours today. FINDINGS: Axial and coronal DWI, axial FLAIR and SWI imaging only by request of Neurology. Cerebral volume is not significantly changed since 2010. No abnormal diffusion identified.  No evidence of acute ischemia. No midline shift, mass effect, evidence of mass lesion, ventriculomegaly, extra-axial collection or acute intracranial hemorrhage. Multiple small foci of subcortical white matter T2 and FLAIR hyperintensity scattered in both hemispheres are new or increased since 2010, such as that at the right frontal operculum on series 7, image 20. But no cortical encephalomalacia or chronic cerebral blood products are identified. Deep gray matter nuclei, brainstem and cerebellum appear stable and within normal limits. Grossly negative visible orbits, mastoids, internal auditory structures. Mild paranasal sinus mucosal thickening as seen by CT earlier. Small upper cervical retropharyngeal lymph nodes are within normal limits. IMPRESSION: 1. No evidence of acute infarct. 2. Stable and negative limited MRI appearance of the brain since 2010 except for increased and now mild to moderate for age scattered nonspecific subcortical white matter changes. Electronically Signed   By: Genevie Ann M.D.   On: 12/07/2020 10:42   DG Chest Port 1 View  Result Date: 12/07/2020 CLINICAL DATA:  Shortness of breath. EXAM: PORTABLE CHEST 1 VIEW COMPARISON:  May 06, 2020. FINDINGS: The heart size and mediastinal contours are within normal limits. Both lungs are clear. The visualized skeletal structures are unremarkable. IMPRESSION: No active disease. Electronically Signed   By: Marijo Conception M.D.   On: 12/07/2020 10:40   CT HEAD CODE STROKE WO CONTRAST  Result Date: 12/07/2020 CLINICAL DATA:  Code stroke. 55 year old female with left side deficits, aphasia. Last known well 0845 hours. EXAM: CT HEAD WITHOUT CONTRAST TECHNIQUE: Contiguous axial images were obtained from the base of the  skull through the vertex without intravenous contrast. COMPARISON:  Brain MRI 04/27/2009.  Head CT 09/21/2017. FINDINGS: Brain: Cerebral volume is within normal limits. No midline shift, ventriculomegaly, mass effect, evidence of mass lesion, intracranial hemorrhage or evidence of cortically based acute infarction. Gray-white matter differentiation is within normal limits throughout the brain. No encephalomalacia identified. Vascular: No suspicious intracranial vascular hyperdensity. Skull: Negative. Sinuses/Orbits: Scattered mild paranasal sinus mucosal thickening now. Tympanic cavities and mastoids remain well aerated. Other: Visualized orbits and scalp soft tissues are within normal limits. No gaze deviation. ASPECTS [facility] Stroke Program Early CT Score) Total score (0-10 with 10 being normal): 10 IMPRESSION: 1. Stable and normal noncontrast CT appearance of the brain. ASPECTS 10. 2. These  results were communicated to Dr. Rory Percy at 9:34 am on 12/07/2020 by text page via the [facility] messaging system. Electronically Signed   By: Genevie Ann M.D.   On: 12/07/2020 09:34    Procedures Procedures  Medications Ordered in the ED Medications  sodium chloride flush (NS) 0.9 % injection 3 mL (0 mLs Intravenous Hold 12/07/20 1103)  ketorolac (TORADOL) 30 MG/ML injection 30 mg (30 mg Intravenous Given 12/07/20 1120)     MDM Rules/Calculators/A&P MDM   Neurology at bedside on patient arrival. She was taken emergently to CT.   ED Course  I have reviewed the triage vital signs and the nursing notes.  Pertinent labs & imaging results that were available during my care of the patient were reviewed by me and considered in my medical decision making (see chart for details).  Clinical Course as of 12/07/20 1345  Sun Dec 07, 2020  9242 CT head negative. CBC is normal.  [CS]  6834 Coags normal.  [CS]  1962 CMP is normal.  [CS]  2297 CXR is normal.  [CS]  9892 MRI is neg for infarct. Patient still not talking or moving  her left side.  [CS]  1147 Per Neurology, patient's symptoms are consistent with conversion disorder. No organic cause of symptoms. She is gradually regaining her ability to talk and move her left side. Husband at bedside is supportive. She has had similar in the past with increased stress. Will continue to monitor in the ED for return to normal.  [CS]  1343 Patient is back to baseline, able to ambulate without difficulty and eager to go home. Will give outpatient counseling resources.  [CS]    Clinical Course User Index [CS] Truddie Hidden, MD    Final Clinical Impression(s) / ED Diagnoses Final diagnoses:  Conversion disorder    Rx / DC Orders ED Discharge Orders     None        Truddie Hidden, MD 12/07/20 1345

## 2020-12-07 NOTE — ED Notes (Signed)
Pt put on purewick.

## 2020-12-07 NOTE — ED Triage Notes (Signed)
Pt from home with ems, called out initially for left arm pain, chest pain and sob. When fire arrived pt was talking but minutes after EMS arrived pt was nonverbal with left sided arm and leg weakness (unable to move). Code stroke called en route. Pt recovering from having COVID 2 weeks ago. Similar episode occurred 5 years ago and was seen at [facility] arrives to ED alert, nonverbal but nods head to yes or no questions. Pt taken to CT with neurology

## 2020-12-07 NOTE — Consult Note (Signed)
Neurology Consultation  Reason for Consult: Code stroke for left-sided weakness Referring Physician: Dr. Karle Starch  CC: Left-sided weakness, aphasia  History is obtained from: EMS  HPI: [name] is a 55 y.o. female unknown past medical history, presented from home via EMS.  EMS was called for left-sided numbness chest pain and as they were there talking to her around 8:45 AM she suddenly became aphasic and stopped talking.  She was able to attend to both sides by her eyes but was not able to talk at all.  They reported complete muteness since then till she was brought to the ER. Patient was unable to provide any history.  No family at bedside. Patient was able to nod yes and no and attempt to write with her right hand in the ER. No headache.  No history of prior migraines.  No history of prior cardiac issues. Has been seen with flank pain and back pain in the past in the ER. Reports that she has had a stroke in the past with pretty much similar symptoms. I could not find the prior "stroke" history but I found a EMU report in [place] from December 2010 which is pasted below 'Referring EMU Diagnosis Code: 780.39  HISTORY:  This is a 55 year old African-American female presenting with a  2-3 weeks history of left-sided weakness, numbness, and inability to speak.  She complains of a feeling of fluttering in her stomach prior to the  events.  She was treated at an outside facility for possible TIA with normal  MRI.  She was then diagnosed with conversion disorder.  The patient was then  seen by a neurologist in [place], [place] who performed and EEG and felt  it was consistent with nonconvulsive status and subsequently admitted her to  [facility] where she had no further EEG studies, but was  started on Depakote.  Her spells have persisted since that time.  EEG was  ordered to capture these spells and rule out seizure.   MEDICATIONS:  Lovenox and Depakote.    TOTAL EEG RECORDING TIME:  2 hours, 46 minutes, and 54 seconds.   EEG DESCRIPTION:  This is an extended 18-channel adult EEG recording with  one channel devoted to limited EKG.  Activating procedures included photic  stimulation and verbal stimulation.  The EEG was obtained with the patient  in the awake and asleep states.   Upon maximal arousal, the predominant background rhythm consists of 9 Hz  activity ranging from approximately 10-30 uV in amplitude.  The background  did appropriately attenuate with eye opening and appeared symmetric.  With  drowsiness, there was slowing of the background rhythm and sleep  architecture in the form of vertex waves and spindles were identified.  Photic stimulation did result in photic driving at higher flash  frequencies.  Several mouth movements were captured on video and were  associated with glossokinetic artifact on EEG, but there was no EEG  correlate suggestive of seizure activity.   EEG INTERPRETATION:  This is a normal awake and asleep adult EEG.  Oral  movements were captured but did not have a EEG correlate suggestive of  seizure activity.   I have personally read and reviewed this Epilepsy Monitoring Unit Report and  agree with the interpretation.   SIGNED BY:  SIGNED BY:  JESSICA TATE                                   POPLI, Gillermo Murdoch, MD  EMU Resident                                   EMU Attending    LKW: 8:45 AM tpa given?: no, functional exam Premorbid modified Rankin scale (mRS): 0   ROS: Unable to obtain due to altered mental status.   Past Medical History:  Diagnosis Date   Anemia         Family History  Problem Relation Age of Onset   Diabetes Maternal Grandmother    Other Maternal Grandfather        brain tumor   Diabetes Father    Hypertension Father    Diabetes Mother    Diabetes Sister    Diabetes Sister    Asthma Daughter      Social History:    reports that she has never smoked. She has never used smokeless tobacco. She reports that she does not drink alcohol and does not use drugs.  Medications  Current Facility-Administered Medications:    sodium chloride flush (NS) 0.9 % injection 3 mL, 3 mL, Intravenous, Once, Truddie Hidden, MD  Current Outpatient Medications:    lidocaine (LIDODERM) 5 %, Place 1 patch onto the skin daily. Remove & Discard patch within 12 hours or as directed by MD (Patient not taking: Reported on 05/06/2020), Disp: 30 patch, Rfl: 0   megestrol (MEGACE) 40 MG tablet, 3 tablets a day for 5 days, 2 tablets a day for 5 days then 1 tablet daily (Patient not taking: Reported on 05/06/2020), Disp: 45 tablet, Rfl: 3   methocarbamol (ROBAXIN) 500 MG tablet, Take 1 tablet (500 mg total) by mouth 2 (two) times daily. (Patient not taking: Reported on 05/06/2020), Disp: 20 tablet, Rfl: 0   Multiple Vitamin (MULTIVITAMIN) tablet, Take 1 tablet by mouth. Takes once or twice a week (Patient not taking: Reported on 05/06/2020), Disp: , Rfl:    predniSONE (DELTASONE) 10 MG tablet, Take 1 tablet (10 mg total) by mouth 3 (three) times daily., Disp: 90 tablet, Rfl: 1   Exam: Current vital signs: BP 105/63   Pulse (!) 50   Temp 98.6 F (37 C) (Oral)   Resp 11   LMP 08/18/2018   SpO2 100%  Vital signs in last 24 hours: Temp:  [98.6 F (37 C)] 98.6 F (37 C) (07/03 1103) Pulse Rate:  [50-60] 50 (07/03 1200) Resp:  [8-16] 11 (07/03 1200) BP: (105-143)/(63-74) 105/63 (07/03 1200) SpO2:  [97 %-100 %] 100 % (07/03 1200) General: Awake alert in no distress HEENT: Normocephalic/atraumatic Lungs clear Cardiovascular: Regular rhythm Abdomen nondistended nontender Extremities warm well perfused Neurological exam Awake alert Is able to nod yes and no appropriately to questions She is completely mute She is able to make symbols and signs using her right hand appropriately Cranial nerves: Pupils equal round react light,  extraocular movements intact, visual fields full, facial sensation diminished on the left side with sharp cut off in the midline and splitting of the vibratory sense on the forehead, face appears symmetric but she refuses to smile, auditory acuity appears intact to conversational voice. Motor exam: Effort dependent flaccidity of left arm and leg.  Normal  right side. Sensory exam with no sensation on the left-when asked do you feel me touching she says no every time I poked her arm. Coordination cannot be assessed NIH stroke scale-17  Labs I have reviewed labs in epic and the results pertinent to this consultation are:   CBC    Component Value Date/Time   WBC 4.9 12/07/2020 0921   RBC 4.68 12/07/2020 0921   HGB 14.6 12/07/2020 0924   HCT 43.0 12/07/2020 0924   PLT 299 12/07/2020 0921   MCV 91.2 12/07/2020 0921   MCH 29.7 12/07/2020 0921   MCHC 32.6 12/07/2020 0921   RDW 13.2 12/07/2020 0921   LYMPHSABS 1.9 12/07/2020 0921   MONOABS 0.4 12/07/2020 0921   EOSABS 0.1 12/07/2020 0921   BASOSABS 0.0 12/07/2020 0921    CMP     Component Value Date/Time   NA 142 12/07/2020 0924   K 4.2 12/07/2020 0924   CL 107 12/07/2020 0924   CO2 24 12/07/2020 0921   GLUCOSE 82 12/07/2020 0924   BUN 15 12/07/2020 0924   CREATININE 0.70 12/07/2020 0924   CALCIUM 9.3 12/07/2020 0921   PROT 7.4 12/07/2020 0921   ALBUMIN 3.6 12/07/2020 0921   AST 21 12/07/2020 0921   ALT 18 12/07/2020 0921   ALKPHOS 92 12/07/2020 0921   BILITOT 0.7 12/07/2020 0921   GFRNONAA >60 12/07/2020 0921   GFRAA >60 08/18/2018 1433    Lipid Panel     Component Value Date/Time   CHOL  04/26/2009 0440    135        ATP III CLASSIFICATION:  <200     mg/dL   Desirable  200-239  mg/dL   Borderline High  >=240    mg/dL   High          TRIG 75 04/26/2009 0440   HDL 33 (L) 04/26/2009 0440   CHOLHDL 4.1 04/26/2009 0440   VLDL 15 04/26/2009 0440   LDLCALC  04/26/2009 0440    87        Total Cholesterol/HDL:CHD  Risk Coronary Heart Disease Risk Table                     Men   Women  1/2 Average Risk   3.4   3.3  Average Risk       5.0   4.4  2 X Average Risk   9.6   7.1  3 X Average Risk  23.4   11.0        Use the calculated Patient Ratio above and the CHD Risk Table to determine the patient's CHD Risk.        ATP III CLASSIFICATION (LDL):  <100     mg/dL   Optimal  100-129  mg/dL   Near or Above                    Optimal  130-159  mg/dL   Borderline  160-189  mg/dL   High  >190     mg/dL   Very High     Imaging I have reviewed the images obtained:  CT-scan of the brain-no acute changes.  Aspects 10  MRI examination of the brain-no acute infarct.  Please see formal read in the chart  Assessment: 55 year old with a prior history of questionable stroke versus seizure with an EMU visit that revealed no EEG correlates for her episodes of staring, presenting with sudden onset of left-sided weakness and aphasia. Her exam  appears extremely functional-see details above. I suspect this to be conversion disorder but did proceed with a stat MRI to rule out a stroke. Stat MRI without evidence of acute stroke. At this point, I think, given her past history, this points to a stress response/conversion disorder.  Recommendations  No inpatient neurological recommendations at this time. I have discussed my plan with Dr. Karle Starch. If she continues to have persistent symptoms, may need a psychiatry consultation.   -- Amie Portland, MD Neurologist [facility] Pager: [phone]

## 2021-10-15 ENCOUNTER — Other Ambulatory Visit: Payer: Self-pay | Admitting: Obstetrics and Gynecology

## 2021-10-15 ENCOUNTER — Other Ambulatory Visit (HOSPITAL_COMMUNITY)
Admission: RE | Admit: 2021-10-15 | Discharge: 2021-10-15 | Disposition: A | Discharge status: Home / Self Care | Payer: BC Managed Care – PPO | Source: Ambulatory Visit | Attending: Obstetrics and Gynecology | Admitting: Obstetrics and Gynecology

## 2021-10-15 DIAGNOSIS — Z01419 Encounter for gynecological examination (general) (routine) without abnormal findings: Secondary | ICD-10-CM | POA: Insufficient documentation

## 2021-10-22 LAB — CYTOLOGY - PAP
Adequacy: ABSENT
Chlamydia: NEGATIVE
Comment: NEGATIVE
Comment: NEGATIVE
Comment: NEGATIVE
Comment: NEGATIVE
Comment: NEGATIVE
Comment: NEGATIVE
Comment: NORMAL
Diagnosis: NEGATIVE
HPV 16: NEGATIVE
HPV 18 / 45: NEGATIVE
HSV1: NEGATIVE
HSV2: NEGATIVE
High risk HPV: POSITIVE — AB
Neisseria Gonorrhea: NEGATIVE
Trichomonas: NEGATIVE

## 2022-01-04 IMAGING — CT CT HEAD CODE STROKE
3 series · 15 of 47 positions shown, 18 images · non-contrast
Comparison: Brain MRI 04/27/2009.  Head CT 09/21/2017.

CLINICAL DATA: Code stroke. 54-year-old female with left side
deficits, aphasia. Last known well 2351 hours.

EXAM:
CT HEAD WITHOUT CONTRAST
TECHNIQUE: Contiguous axial images were obtained from the base of the skull
through the vertex without intravenous contrast.

[Series 4: head 5.0 h30s · axial · 0.45mm/px · z∈[-77,+68]mm · 9 of 35 slices shown, 12 images]
[im 3/35  brain]
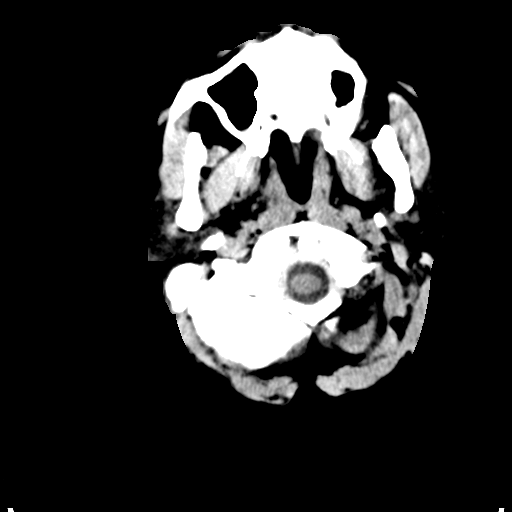
[im 3/35  bone]
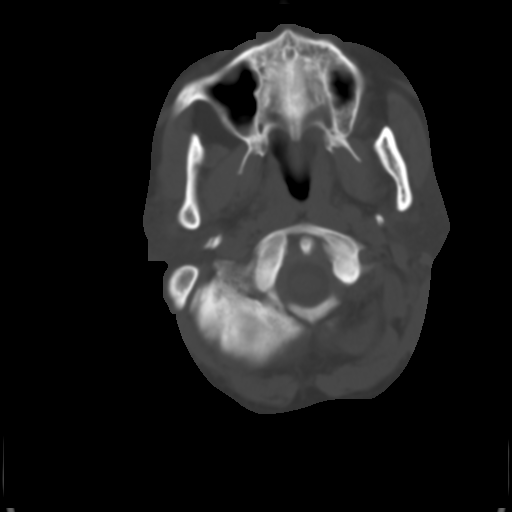
[im 6/35  brain]
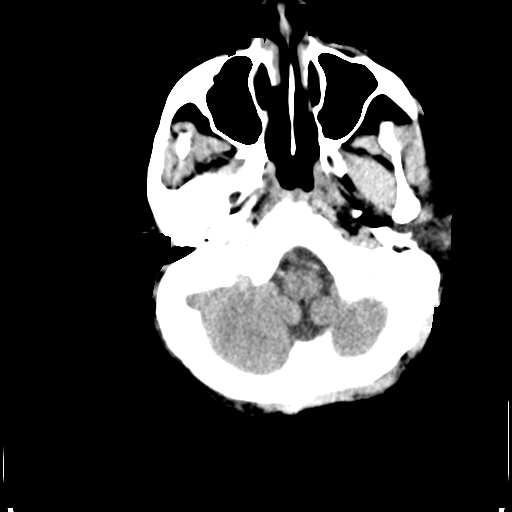
[im 10/35  brain]
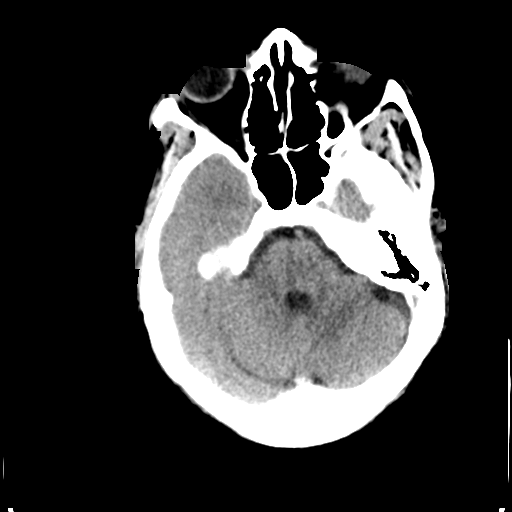
[im 13/35  brain]
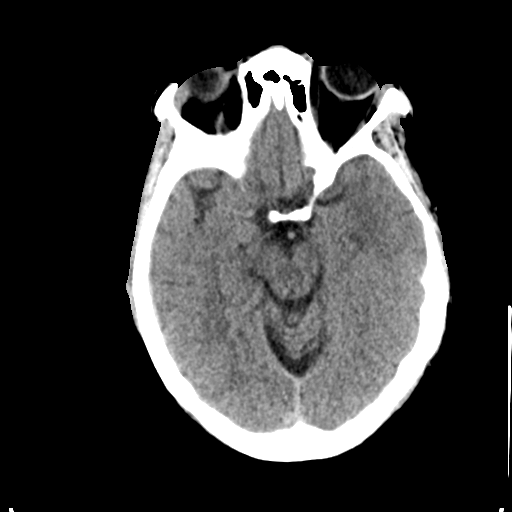
[im 18/35  brain]
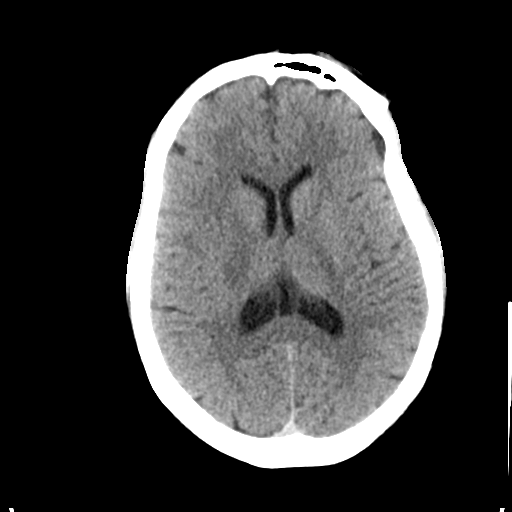
[im 18/35  bone]
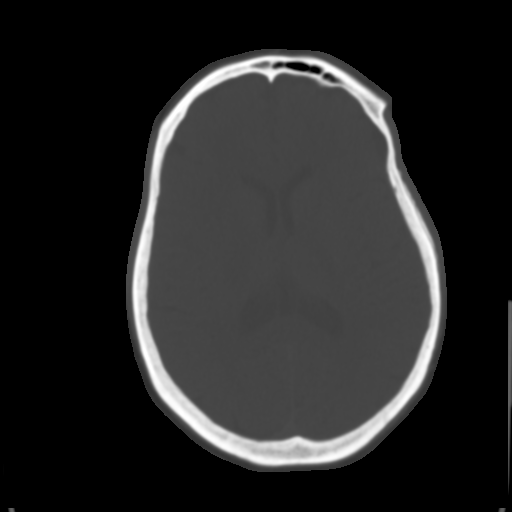
[im 22/35  brain]
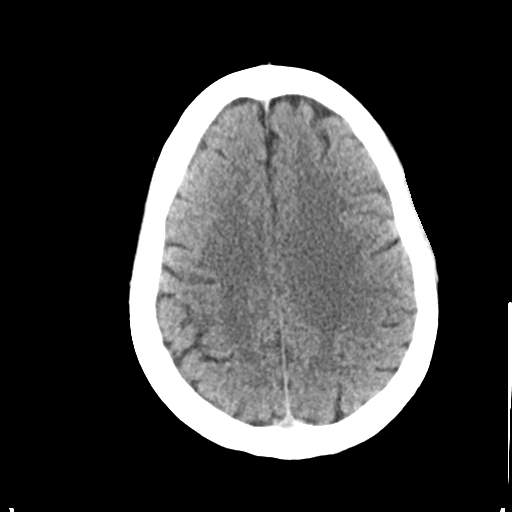
[im 25/35  brain]
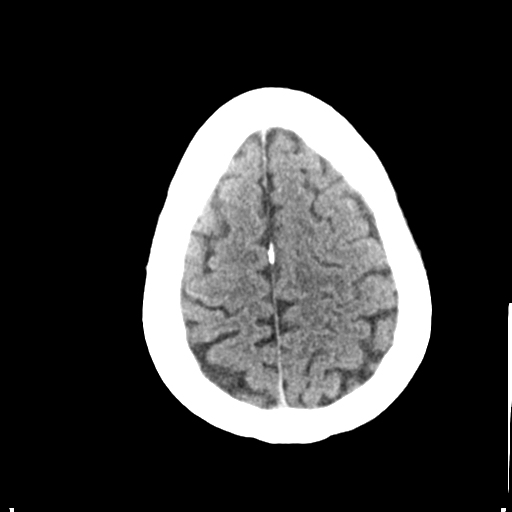
[im 29/35  brain]
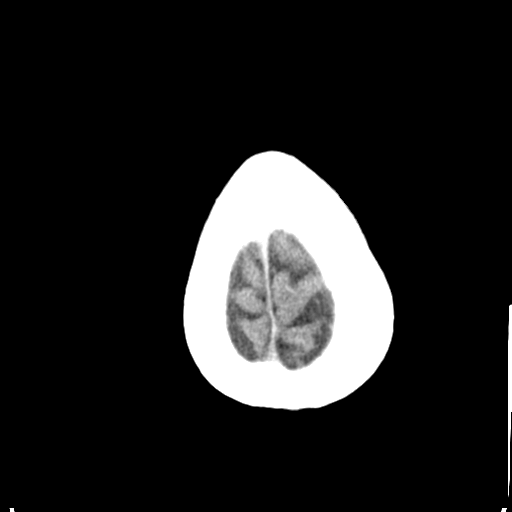
[im 32/35  brain]
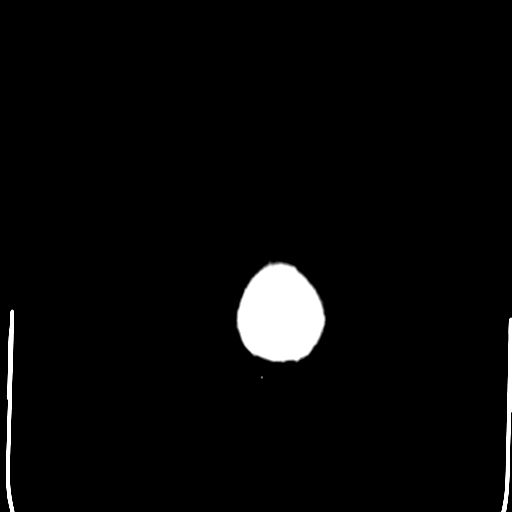
[im 32/35  bone]
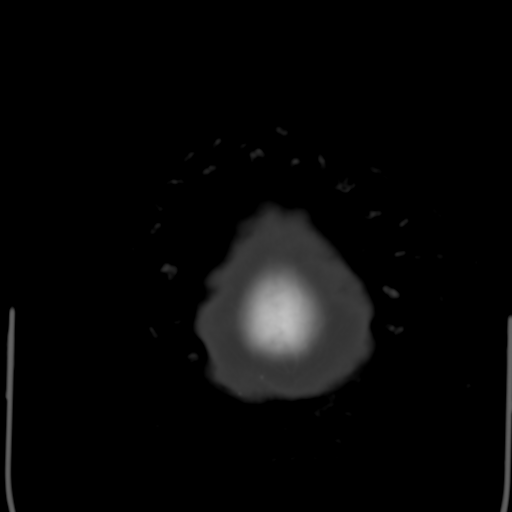

[Series 5: head 3.0 mpr cor · coronal · 0.33mm/px · 3 of 70 slices shown]
[im 24/70  brain]
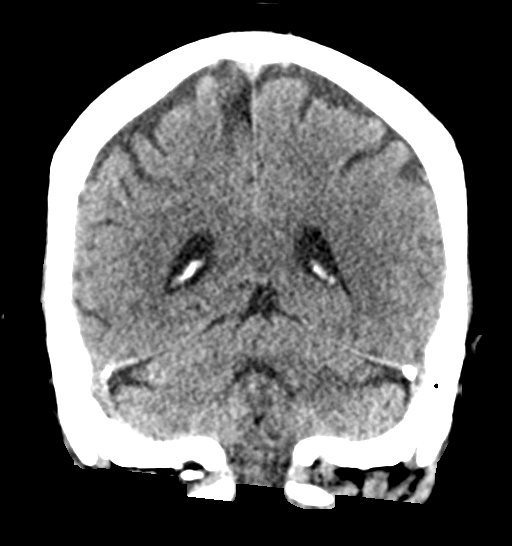
[im 31/70  brain]
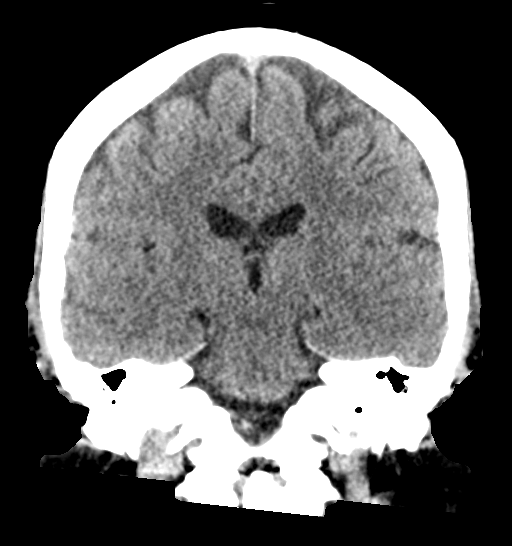
[im 39/70  brain]
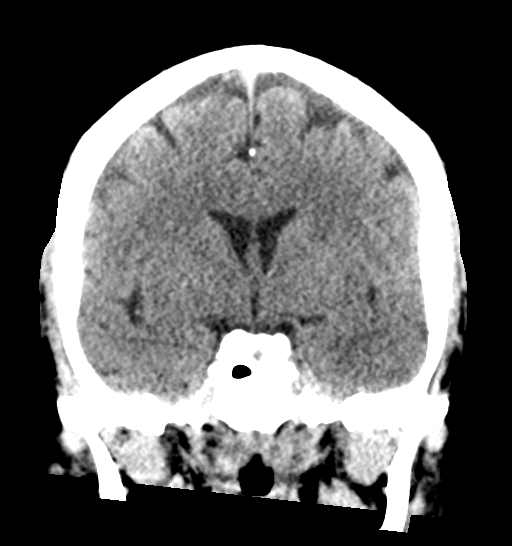

[Series 6: head 3.0 mpr sag · sagittal · 0.35mm/px · 3 of 54 slices shown]
[im 19/54  brain]
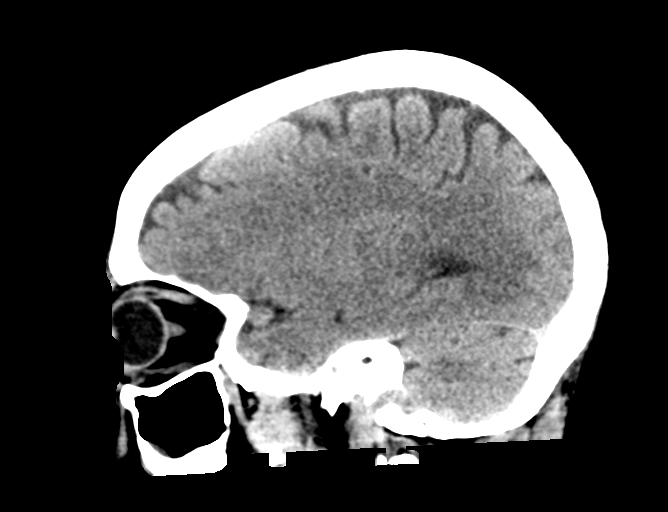
[im 27/54  brain]
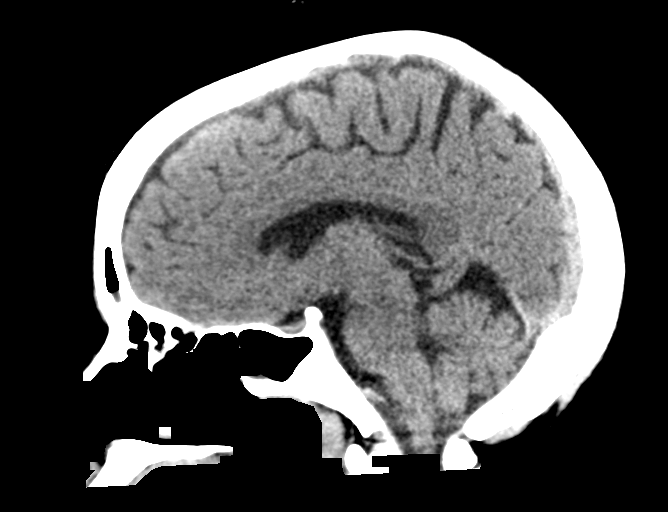
[im 36/54  brain]
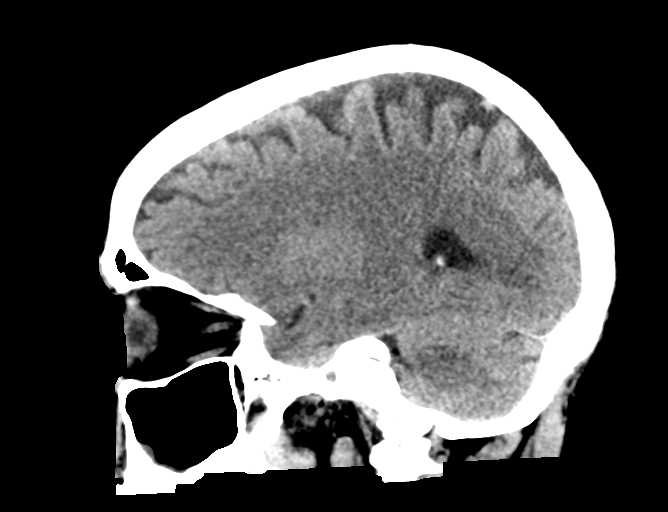

[15 of 47 positions shown; findings below may reference images not displayed]

FINDINGS: Brain: Cerebral volume is within normal limits. No midline shift,
ventriculomegaly, mass effect, evidence of mass lesion, intracranial
hemorrhage or evidence of cortically based acute infarction.
Gray-white matter differentiation is within normal limits throughout
the brain. No encephalomalacia identified.

Vascular: No suspicious intracranial vascular hyperdensity.

Skull: Negative.

Sinuses/Orbits: Scattered mild paranasal sinus mucosal thickening
now. Tympanic cavities and mastoids remain well aerated.

Other: Visualized orbits and scalp soft tissues are within normal
limits. No gaze deviation.

ASPECTS ([place] Stroke Program Early CT Score)

Total score (0-10 with 10 being normal): 10
IMPRESSION: 1. Stable and normal noncontrast CT appearance of the brain. ASPECTS
10.
2. These results were communicated to Dr. Brayan Sotero at [DATE] on
12/07/2020 by text page via the [employer] messaging system.

## 2022-03-08 ENCOUNTER — Other Ambulatory Visit: Payer: Self-pay

## 2022-03-09 ENCOUNTER — Other Ambulatory Visit: Payer: Self-pay

## 2022-03-09 MED ORDER — SEMAGLUTIDE-WEIGHT MANAGEMENT 1 MG/0.5ML ~~LOC~~ SOAJ
SUBCUTANEOUS | 0 refills | Status: AC
  Filled 2022-03-09: qty 2, 28d supply, fill #0

## 2022-03-11 ENCOUNTER — Other Ambulatory Visit: Payer: Self-pay

## 2022-03-15 ENCOUNTER — Other Ambulatory Visit: Payer: Self-pay

## 2022-03-17 ENCOUNTER — Other Ambulatory Visit: Payer: Self-pay | Admitting: Family Medicine

## 2022-03-17 DIAGNOSIS — Z1231 Encounter for screening mammogram for malignant neoplasm of breast: Secondary | ICD-10-CM

## 2022-05-10 ENCOUNTER — Ambulatory Visit
Admission: RE | Admit: 2022-05-10 | Discharge: 2022-05-10 | Disposition: A | Discharge status: Home / Self Care | Payer: BC Managed Care – PPO | Source: Ambulatory Visit | Attending: Family Medicine | Admitting: Family Medicine

## 2022-05-10 DIAGNOSIS — Z1231 Encounter for screening mammogram for malignant neoplasm of breast: Secondary | ICD-10-CM

## 2022-05-21 ENCOUNTER — Other Ambulatory Visit (HOSPITAL_COMMUNITY)
Admission: RE | Admit: 2022-05-21 | Discharge: 2022-05-21 | Disposition: A | Discharge status: Home / Self Care | Payer: BC Managed Care – PPO | Source: Ambulatory Visit | Attending: Family Medicine | Admitting: Family Medicine

## 2022-05-21 DIAGNOSIS — Z124 Encounter for screening for malignant neoplasm of cervix: Secondary | ICD-10-CM | POA: Insufficient documentation

## 2022-05-24 ENCOUNTER — Other Ambulatory Visit: Payer: Self-pay

## 2022-05-24 MED ORDER — SEMAGLUTIDE-WEIGHT MANAGEMENT 2.4 MG/0.75ML ~~LOC~~ SOAJ
2.4000 mg | SUBCUTANEOUS | 0 refills | Status: AC
  Filled 2022-05-24: qty 3, 28d supply, fill #0

## 2022-05-26 LAB — CYTOLOGY - PAP
Adequacy: ABSENT
Comment: NEGATIVE
Diagnosis: UNDETERMINED — AB
High risk HPV: POSITIVE — AB

## 2022-06-17 ENCOUNTER — Other Ambulatory Visit: Payer: Self-pay

## 2022-06-17 MED ORDER — ZEPBOUND 2.5 MG/0.5ML ~~LOC~~ SOAJ
2.5000 mg | SUBCUTANEOUS | 0 refills | Status: AC
  Filled 2022-06-17 – 2022-08-11 (×2): qty 2, 28d supply, fill #0

## 2022-06-18 ENCOUNTER — Other Ambulatory Visit: Payer: Self-pay

## 2022-06-22 ENCOUNTER — Other Ambulatory Visit: Payer: Self-pay

## 2022-06-29 ENCOUNTER — Other Ambulatory Visit: Payer: Self-pay

## 2022-07-07 ENCOUNTER — Ambulatory Visit: Payer: BC Managed Care – PPO | Admitting: Obstetrics and Gynecology

## 2022-07-07 ENCOUNTER — Encounter: Payer: Self-pay | Admitting: Obstetrics and Gynecology

## 2022-07-07 ENCOUNTER — Other Ambulatory Visit (HOSPITAL_COMMUNITY)
Admission: RE | Admit: 2022-07-07 | Discharge: 2022-07-07 | Disposition: A | Discharge status: Home / Self Care | Payer: BC Managed Care – PPO | Source: Ambulatory Visit | Attending: Obstetrics and Gynecology | Admitting: Obstetrics and Gynecology

## 2022-07-07 VITALS — BP 111/73 | HR 63 | Wt 183.0 lb

## 2022-07-07 DIAGNOSIS — R87810 Cervical high risk human papillomavirus (HPV) DNA test positive: Secondary | ICD-10-CM

## 2022-07-07 DIAGNOSIS — Z01812 Encounter for preprocedural laboratory examination: Secondary | ICD-10-CM

## 2022-07-07 DIAGNOSIS — R87610 Atypical squamous cells of undetermined significance on cytologic smear of cervix (ASC-US): Secondary | ICD-10-CM

## 2022-07-07 DIAGNOSIS — R87619 Unspecified abnormal cytological findings in specimens from cervix uteri: Secondary | ICD-10-CM | POA: Insufficient documentation

## 2022-07-07 LAB — POCT URINE PREGNANCY: Preg Test, Ur: NEGATIVE

## 2022-07-07 NOTE — Progress Notes (Signed)
Pt is new to office, here today for abnormal pap and colposcopy.

## 2022-07-07 NOTE — Patient Instructions (Signed)
Colposcopy, Care After  The following information offers guidance on how to care for yourself after your procedure. Your doctor may also give you more specific instructions. If you have problems or questions, contact your doctor. What can I expect after the procedure? If you did not have a sample of your tissue taken out (did not have a biopsy), you may only have some spotting of blood for a few days. You can go back to your normal activities. If you had a sample of your tissue taken out, it is common to have: Soreness and mild pain. These may last for a few days. Mild bleeding or fluid (discharge) coming from your vagina. The fluid will look dark and grainy. You may have this for a few days. The fluid may be caused by a liquid that was used during your procedure. You may need to wear a sanitary pad. Spotting of blood for at least 48 hours after the procedure. Follow these instructions at home: Medicines Take over-the-counter and prescription medicines only as told by your doctor. Ask your doctor what over-the-counter pain medicines and prescription medicines you can start taking again. This is very important if you take blood thinners. Activity For at least 3 days, or for as long as told by your doctor, avoid: Douching. Using tampons. Having sex. Return to your normal activities as told by your doctor. Ask your doctor what activities are safe for you. General instructions Ask your doctor if you may take baths, swim, or use a hot tub. You may take showers. If you use birth control (contraception), keep using it. Keep all follow-up visits. Contact a doctor if: You have a fever or chills. You faint or feel light-headed. Get help right away if: You bleed a lot from your vagina. A lot of bleeding means that the bleeding soaks through a pad in less than 1 hour. You have clumps of blood (blood clots) coming from your vagina. You have signs that could mean you have an infection. This may be  fluid coming from your vagina that is: Different than normal. Yellow. Bad-smelling. You have very bad pain or cramps in your lower belly that do not get better with medicine. Summary If you did not have a sample of your tissue taken out, you may only have some spotting of blood for a few days. You can go back to your normal activities. If you had a sample of your tissue taken out, it is common to have mild pain for a few days and spotting for 48 hours. Avoid douching, using tampons, and having sex for at least 3 days after the procedure or for as long as told. Get help right away if you have a lot of bleeding, very bad pain, or signs of infection. This information is not intended to replace advice given to you by your health care provider. Make sure you discuss any questions you have with your health care provider. Document Revised: 10/19/2020 Document Reviewed: 10/19/2020 Elsevier Patient Education  2023 [employer]  

## 2022-07-07 NOTE — Progress Notes (Signed)
    GYNECOLOGY CLINIC COLPOSCOPY PROCEDURE NOTE  57 y.o. [id number] here for colposcopy for ASCUS with POSITIVE high risk HPV pap smear on 12/23. Discussed role for HPV in cervical dysplasia, need for surveillance.  Patient given informed consent, signed copy in the chart, time out was performed.  Placed in lithotomy position. Cervix viewed with speculum and colposcope after application of acetic acid.   Colposcopy adequate? Yes  no visible lesions; Random Bx at 12 o'clock.  ECC specimen obtained. All specimens were labelled and sent to pathology. Monsel's applied Vaginal mucosa atrophic. Cervix almost flush with vaginal side wall.  Patient was given post procedure instructions.  Will follow up pathology and manage accordingly.  Routine preventative health maintenance measures emphasized.    Arlina Robes, MD, [place] Attending [place] for [facility]

## 2022-07-08 LAB — SURGICAL PATHOLOGY

## 2022-07-13 ENCOUNTER — Other Ambulatory Visit: Payer: Self-pay

## 2022-07-16 ENCOUNTER — Other Ambulatory Visit: Payer: Self-pay

## 2022-07-19 ENCOUNTER — Other Ambulatory Visit: Payer: Self-pay

## 2022-07-20 ENCOUNTER — Other Ambulatory Visit: Payer: Self-pay

## 2022-07-21 ENCOUNTER — Encounter: Payer: BC Managed Care – PPO | Admitting: Obstetrics and Gynecology

## 2022-07-21 ENCOUNTER — Other Ambulatory Visit: Payer: Self-pay

## 2022-07-23 ENCOUNTER — Other Ambulatory Visit: Payer: Self-pay

## 2022-07-26 ENCOUNTER — Other Ambulatory Visit: Payer: Self-pay

## 2022-08-03 ENCOUNTER — Other Ambulatory Visit: Payer: Self-pay

## 2022-08-05 ENCOUNTER — Encounter: Payer: Self-pay | Admitting: Radiology

## 2022-08-10 ENCOUNTER — Other Ambulatory Visit: Payer: Self-pay

## 2022-08-11 ENCOUNTER — Other Ambulatory Visit: Payer: Self-pay

## 2022-08-12 ENCOUNTER — Other Ambulatory Visit: Payer: Self-pay

## 2022-08-12 NOTE — Progress Notes (Signed)
Patient seen by Lazarus Gowda, PharmD Candidate on 08/11/2022   while they were picking up prescriptions at [place] at [facility].   Blood pressure today was : 110/76, HR 67   Medication review was performed. They are taking medications as prescribed.   The following barriers to adherence were noted:  - They do not have cost concerns.  - They do not have transportation concerns.  - They do not need assistance obtaining refills.  - They do not occasionally forget to take some of their prescribed medications.  - They do not feel like one/some of their medications make them feel poorly.  - They do not have questions or concerns about their medications.  - They do not have follow up scheduled with their primary care provider/cardiologist.   The following interventions were completed:  - Medications were reviewed   Patient does not have dx of HTN but wanted their BP check because they havent checked in a while.    Lazarus Gowda, PharmD Candidate   Joseph Art, Pharm.D. PGY-2 Ambulatory Care Pharmacy Resident

## 2022-08-19 ENCOUNTER — Other Ambulatory Visit: Payer: Self-pay

## 2022-08-19 MED ORDER — ZEPBOUND 5 MG/0.5ML ~~LOC~~ SOAJ
5.0000 mg | SUBCUTANEOUS | 0 refills | Status: AC
  Filled 2022-08-19: qty 2, 28d supply, fill #0

## 2022-08-26 ENCOUNTER — Other Ambulatory Visit: Payer: Self-pay

## 2022-09-20 ENCOUNTER — Emergency Department (HOSPITAL_COMMUNITY): Payer: BC Managed Care – PPO

## 2022-09-20 ENCOUNTER — Other Ambulatory Visit: Payer: Self-pay

## 2022-09-20 ENCOUNTER — Emergency Department (HOSPITAL_COMMUNITY)
Admission: EM | Admit: 2022-09-20 | Discharge: 2022-09-20 | Disposition: A | Discharge status: Home / Self Care | Payer: BC Managed Care – PPO | Attending: Emergency Medicine | Admitting: Emergency Medicine

## 2022-09-20 DIAGNOSIS — S161XXA Strain of muscle, fascia and tendon at neck level, initial encounter: Secondary | ICD-10-CM

## 2022-09-20 DIAGNOSIS — M542 Cervicalgia: Secondary | ICD-10-CM | POA: Diagnosis present

## 2022-09-20 DIAGNOSIS — S39012A Strain of muscle, fascia and tendon of lower back, initial encounter: Secondary | ICD-10-CM

## 2022-09-20 DIAGNOSIS — Y92410 Unspecified street and highway as the place of occurrence of the external cause: Secondary | ICD-10-CM | POA: Diagnosis not present

## 2022-09-20 MED ORDER — METHOCARBAMOL 500 MG PO TABS: 500.0000 mg | ORAL_TABLET | Freq: Four times a day (QID) | ORAL | 0 refills | Status: AC

## 2022-09-20 MED ORDER — IBUPROFEN 800 MG PO TABS: 800.0000 mg | ORAL_TABLET | Freq: Three times a day (TID) | ORAL | 0 refills | Status: DC | PRN

## 2022-09-20 MED ORDER — HYDROCODONE-ACETAMINOPHEN 5-325 MG PO TABS
2.0000 | ORAL_TABLET | Freq: Once | ORAL | Status: AC
  Administered 2022-09-20: 2 via ORAL
  Filled 2022-09-20: qty 2
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
## 2022-09-20 NOTE — ED Notes (Signed)
DC instructions reviewed with pt. PT verbalized understanding. Pt DC °

## 2022-09-20 NOTE — Discharge Instructions (Addendum)
Return if any problems.  See Dr. Parke Simmers for recheck in 1 week if pain persist

## 2022-10-06 ENCOUNTER — Emergency Department (HOSPITAL_COMMUNITY)
Admission: EM | Admit: 2022-10-06 | Discharge: 2022-10-06 | Disposition: A | Discharge status: Home / Self Care | Payer: BC Managed Care – PPO | Attending: Emergency Medicine | Admitting: Emergency Medicine

## 2022-10-06 ENCOUNTER — Emergency Department (HOSPITAL_COMMUNITY): Payer: BC Managed Care – PPO

## 2022-10-06 DIAGNOSIS — R4182 Altered mental status, unspecified: Secondary | ICD-10-CM | POA: Diagnosis not present

## 2022-10-06 DIAGNOSIS — R569 Unspecified convulsions: Secondary | ICD-10-CM | POA: Diagnosis not present

## 2022-10-06 DIAGNOSIS — Z7982 Long term (current) use of aspirin: Secondary | ICD-10-CM | POA: Diagnosis not present

## 2022-10-06 DIAGNOSIS — R29810 Facial weakness: Secondary | ICD-10-CM | POA: Insufficient documentation

## 2022-10-06 DIAGNOSIS — F449 Dissociative and conversion disorder, unspecified: Secondary | ICD-10-CM | POA: Diagnosis present

## 2022-10-06 LAB — CBC
HCT: 41.1 % (ref 36.0–46.0)
Hemoglobin: 13.3 g/dL (ref 12.0–15.0)
MCH: 29.8 pg (ref 26.0–34.0)
MCHC: 32.4 g/dL (ref 30.0–36.0)
MCV: 92.2 fL (ref 80.0–100.0)
Platelets: 273 10*3/uL (ref 150–400)
RBC: 4.46 MIL/uL (ref 3.87–5.11)
RDW: 13.9 % (ref 11.5–15.5)
WBC: 4.8 10*3/uL (ref 4.0–10.5)
nRBC: 0 % (ref 0.0–0.2)

## 2022-10-06 LAB — DIFFERENTIAL
Abs Immature Granulocytes: 0.01 10*3/uL (ref 0.00–0.07)
Basophils Absolute: 0 10*3/uL (ref 0.0–0.1)
Basophils Relative: 1 %
Eosinophils Absolute: 0.1 10*3/uL (ref 0.0–0.5)
Eosinophils Relative: 2 %
Immature Granulocytes: 0 %
Lymphocytes Relative: 38 %
Lymphs Abs: 1.8 10*3/uL (ref 0.7–4.0)
Monocytes Absolute: 0.3 10*3/uL (ref 0.1–1.0)
Monocytes Relative: 7 %
Neutro Abs: 2.5 10*3/uL (ref 1.7–7.7)
Neutrophils Relative %: 52 %

## 2022-10-06 LAB — I-STAT CHEM 8, ED
BUN: 18 mg/dL (ref 6–20)
Calcium, Ion: 1.03 mmol/L — ABNORMAL LOW (ref 1.15–1.40)
Chloride: 114 mmol/L — ABNORMAL HIGH (ref 98–111)
Creatinine, Ser: 0.5 mg/dL (ref 0.44–1.00)
Glucose, Bld: 97 mg/dL (ref 70–99)
HCT: 38 % (ref 36.0–46.0)
Hemoglobin: 12.9 g/dL (ref 12.0–15.0)
Potassium: 3.9 mmol/L (ref 3.5–5.1)
Sodium: 143 mmol/L (ref 135–145)
TCO2: 24 mmol/L (ref 22–32)

## 2022-10-06 LAB — COMPREHENSIVE METABOLIC PANEL
ALT: 17 U/L (ref 0–44)
AST: 17 U/L (ref 15–41)
Albumin: 3.4 g/dL — ABNORMAL LOW (ref 3.5–5.0)
Alkaline Phosphatase: 123 U/L (ref 38–126)
Anion gap: 9 (ref 5–15)
BUN: 15 mg/dL (ref 6–20)
CO2: 26 mmol/L (ref 22–32)
Calcium: 8.9 mg/dL (ref 8.9–10.3)
Chloride: 106 mmol/L (ref 98–111)
Creatinine, Ser: 0.84 mg/dL (ref 0.44–1.00)
GFR, Estimated: >60 mL/min (ref >60)
Glucose, Bld: 65 mg/dL — ABNORMAL LOW (ref 70–99)
Potassium: 3.7 mmol/L (ref 3.5–5.1)
Sodium: 141 mmol/L (ref 135–145)
Total Bilirubin: 0.5 mg/dL (ref 0.3–1.2)
Total Protein: 6.9 g/dL (ref 6.5–8.1)

## 2022-10-06 LAB — PROTIME-INR
INR: 1 (ref 0.8–1.2)
Prothrombin Time: 13.4 seconds (ref 11.4–15.2)

## 2022-10-06 LAB — CBG MONITORING, ED: Glucose-Capillary: 96 mg/dL (ref 70–99)

## 2022-10-06 LAB — APTT: aPTT: 28 seconds (ref 24–36)

## 2022-10-06 LAB — ETHANOL: Alcohol, Ethyl (B): <10 mg/dL (ref <10)

## 2022-10-06 MED ORDER — LORAZEPAM 2 MG/ML IJ SOLN: 2.0000 mg | Freq: Once | INTRAMUSCULAR | Status: AC

## 2022-10-06 MED ORDER — LORAZEPAM 2 MG/ML IJ SOLN
INTRAMUSCULAR | Status: AC
  Administered 2022-10-06: 2 mg via INTRAVENOUS
  Filled 2022-10-06: qty 1

## 2022-10-06 MED ORDER — IOHEXOL 350 MG/ML SOLN
75.0000 mL | Freq: Once | INTRAVENOUS | Status: AC | PRN
  Administered 2022-10-06: 75 mL via INTRAVENOUS
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
## 2022-10-06 NOTE — ED Notes (Signed)
Pt gone to MRI 
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
## 2022-10-06 NOTE — Consult Note (Signed)
Neurology Consultation  Reason for Consult: Code stroke Referring Physician: Dr. Jarold Motto  CC: Inability to talk, leaning to the left.  History is obtained from: Chart, ER staff  HPI: [name] is a 57 y.o. 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  Last known well 11:45 PM. She was at a restaurant and had a sudden onset of leaning to the left and not talking.  Brought in by family to the emergency room where code stroke was activated. She was unable to provide any history as she is totally mute.  Taken for stat CT head which was unremarkable.  Stat CT angio head and neck with no emergent LVO. While on the CT table, she had fluttering of her eyelids along with seizure-like activity noted for which she was given 2 mg of Ativan IV.  She still remains nonverbal   LKW:.  11:45 AM IV thrombolysis given?: no, inconsistent exam, EVT: No-no LVO Premorbid modified Rankin scale (mRS): 0   ROS: Full ROS was performed and is negative except as noted in the HPI.   Past Medical History:  Diagnosis Date   Anemia    Family History  Problem Relation Age of Onset   Diabetes Maternal Grandmother    Other Maternal Grandfather        brain tumor   Diabetes Father    Hypertension Father    Diabetes Mother    Diabetes Sister    Diabetes Sister    Asthma Daughter    Social History:   reports that she has never smoked. She has never used smokeless tobacco. She reports that she does not drink alcohol and does not use drugs.  Medications No current facility-administered medications for this encounter.  Current Outpatient Medications:    acetaminophen (TYLENOL) 500 MG tablet, Take 500 mg by mouth every 6 (six) hours as needed for  headache., Disp: , Rfl:    aspirin EC 81 MG tablet, Take 81 mg by mouth every 4 (four) hours as needed for mild pain. Swallow whole., Disp: , Rfl:    ibuprofen (ADVIL) 800 MG tablet, Take 1 tablet (800 mg total) by mouth every 8 (eight) hours as needed., Disp: 30 tablet, Rfl: 0   methocarbamol (ROBAXIN) 500 MG tablet, Take 1 tablet (500 mg total) by mouth 4 (four) times daily., Disp: 20 tablet, Rfl: 0   Semaglutide-Weight Management 1 MG/0.5ML SOAJ, Inject 1MG  into the skin weekly, Disp: 2 mL, Rfl: 0   Semaglutide-Weight Management 2.4 MG/0.75ML SOAJ, Inject 2.4 mg as directed once a week., Disp: 3 mL, Rfl: 0   tirzepatide (ZEPBOUND) 2.5 MG/0.5ML Pen, Inject 2.5 mg into the skin once a week., Disp: 2 mL, Rfl: 0   tirzepatide (ZEPBOUND) 5 MG/0.5ML Pen, Inject 5 mg into the skin once a week., Disp: 2 mL, Rfl: 0   Vitamin D, Ergocalciferol, (DRISDOL) 1.25 MG (50000 UNIT) CAPS capsule, Take 50,000 Units by mouth every 7 (seven) days., Disp: , Rfl:   Exam: Current vital signs: BP 118/65   Pulse 72   Resp 17   LMP 08/18/2018   SpO2 99%  Vital signs in last 24 hours: Pulse Rate:  [72-78] 72 (05/01 1430) Resp:  [17-24] 17 (05/01 1430) BP: (118-149)/(65-94) 118/65 (05/01 1430) SpO2:  [97 %-100 %]  99 % (05/01 1430) General: Obtunded, in no apparent distress HEENT: Normocephalic atraumatic Lungs: Clear Cardiovascular: Regular rate rhythm Abdomen nondistended nontender Extremities are warm and well-perfused Neurological examination: She is obtunded Opens eyes to noxious stimulation with some grimace Has volitional tight closing of the eye when trying to examine her pupils.  Pupils take equal round reactive to light.  No forced gaze or gaze preference.  There is some fluttering of her eyelids whenever attempt is made to check facial sensation arise.  Face appears to be pulled to the left. Upon passively lifting her arms up, she is able to lift the right arm and right leg up against gravity without  much drift but the left arm and left leg flop to the bed without any effort against gravity.  Hoover sign positive on the left. Sensation: Does not really move anything to noxious stimulation but grimaces more to noxious stimulation from the right than comparison to the left. Coordination cannot be assessed given her mentation. GENERAL: Awake, alert in NAD HEENT: - Normocephalic and atraumatic, dry mm, no LN++, no Thyromegally LUNGS - Clear to auscultation bilaterally with no wheezes CV - S1S2 RRR, no m/r/g, equal pulses bilaterally. ABDOMEN - Soft, nontender, nondistended with normoactive BS Ext: warm, well perfused, intact peripheral pulses, __ edema  NIHSS 1a Level of Conscious.: 1 1b LOC Questions: 2 1c LOC Commands: 2 2 Best Gaze: 0 3 Visual: 0 4 Facial Palsy: 1 5a Motor Arm - left: 4 5b Motor Arm - Right: 0 6a Motor Leg - Left: 4 6b Motor Leg - Right: 0 7 Limb Ataxia: 0 8 Sensory: 1 9 Best Language: 3 10 Dysarthria: 2 11 Extinct. and Inatten.: 0 TOTAL: 20   Labs I have reviewed labs in epic and the results pertinent to this consultation are:  CBC    Component Value Date/Time   WBC 4.8 10/06/2022 1341   RBC 4.46 10/06/2022 1341   HGB 12.9 10/06/2022 1404   HCT 38.0 10/06/2022 1404   PLT 273 10/06/2022 1341   MCV 92.2 10/06/2022 1341   MCH 29.8 10/06/2022 1341   MCHC 32.4 10/06/2022 1341   RDW 13.9 10/06/2022 1341   LYMPHSABS 1.8 10/06/2022 1341   MONOABS 0.3 10/06/2022 1341   EOSABS 0.1 10/06/2022 1341   BASOSABS 0.0 10/06/2022 1341    CMP     Component Value Date/Time   NA 143 10/06/2022 1404   K 3.9 10/06/2022 1404   CL 114 (H) 10/06/2022 1404   CO2 24 12/07/2020 0921   GLUCOSE 97 10/06/2022 1404   BUN 18 10/06/2022 1404   CREATININE 0.50 10/06/2022 1404   CALCIUM 9.3 12/07/2020 0921   PROT 7.4 12/07/2020 0921   ALBUMIN 3.6 12/07/2020 0921   AST 21 12/07/2020 0921   ALT 18 12/07/2020 0921   ALKPHOS 92 12/07/2020 0921   BILITOT 0.7 12/07/2020  0921   GFRNONAA >60 12/07/2020 0921   GFRAA >60 08/18/2018 1433   EMU report from care everywhere from December 2010 HISTORY:  This is a 57 year old African-American female presenting with a  2-3 weeks history of left-sided weakness, numbness, and inability to speak.  She complains of a feeling of fluttering in her stomach prior to the  events.  She was treated at an outside facility for possible TIA with normal  MRI.  She was then diagnosed with conversion disorder.  The patient was then  seen by a neurologist in [place], [place] who performed and EEG and felt  it was  consistent with nonconvulsive status and subsequently admitted her to  [facility] where she had no further EEG studies, but was  started on Depakote.  Her spells have persisted since that time.  EEG was  ordered to capture these spells and rule out seizure.   MEDICATIONS:  Lovenox and Depakote.   TOTAL EEG RECORDING TIME:  2 hours, 46 minutes, and 54 seconds.   EEG DESCRIPTION:  This is an extended 18-channel adult EEG recording with  one channel devoted to limited EKG.  Activating procedures included photic  stimulation and verbal stimulation.  The EEG was obtained with the patient  in the awake and asleep states.   Upon maximal arousal, the predominant background rhythm consists of 9 Hz  activity ranging from approximately 10-30 uV in amplitude.  The background  did appropriately attenuate with eye opening and appeared symmetric.  With  drowsiness, there was slowing of the background rhythm and sleep  architecture in the form of vertex waves and spindles were identified.  Photic stimulation did result in photic driving at higher flash  frequencies.  Several mouth movements were captured on video and were  associated with glossokinetic artifact on EEG, but there was no EEG  correlate suggestive of seizure activity.   EEG INTERPRETATION:  This is a normal awake and asleep adult EEG.  Oral   movements were captured but did not have a EEG correlate suggestive of  seizure activity.    Imaging I have reviewed the images obtained:  CT-head-no acute changes.  Aspects 10.  No bleed CT angiography head and neck: No ELVO  Assessment:  57 year old woman with prior history of PNES/conversion disorder presenting for evaluation of sudden onset of inability to talk and face drawn to the left along with left-sided weakness. Her exam has multiple inconsistencies including Hoover sign positive on the left side and effort dependent weakness on the left side as I had previously also seen a couple of years ago when she has presented with similar presentation. That said, given that she had at that time provided history of strokes although I could not find any documentation, she was evaluated as an emergent code stroke-CT head and CT angio head and neck with no emergent large vessel occlusion or acute issues. Further evaluation with a stat EEG and an MRI would be prudent to rule out underlying seizures versus stroke although primary suspicion at this time is again for PNES/conversion disorder  Recommendations: Stat EEG MRI at some point to rule out stroke If both are negative, no further inpatient workup.   ADDENDUM EEG normal MRI with no acute findings. No further inpatient work up Plan relayed to C. Alexandria Lodge, PA-c  -- Milon Dikes, MD Neurologist [facility] Pager: [phone]

## 2022-10-06 NOTE — Discharge Instructions (Signed)
Return to the ED with any new symptoms Please follow-up with your PCP

## 2022-10-06 NOTE — Procedures (Signed)
Patient Name: [name]  MRN: [id number]  Epilepsy Attending: Charlsie Quest  Referring Physician/Provider: Mathews Argyle, NP  Date: 10/06/2022 Duration: 23.27 mins  Patient history:  57 y.o. 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 EEG to evaluate for seizure  Level of alertness: Awake, asleep  AEDs during EEG study: Ativan  Technical aspects: This EEG study was done with scalp electrodes positioned according to the 10-20 International system of electrode placement. Electrical activity was reviewed with band pass filter of 1-70Hz , sensitivity of 7 uV/mm, display speed of 38mm/sec with a 60Hz  notched filter applied as appropriate. EEG data were recorded continuously and digitally stored.  Video monitoring was available and reviewed as appropriate.  Description: The posterior dominant rhythm consists of 10-11 Hz activity of moderate voltage (25-35 uV) seen predominantly in posterior head regions, symmetric and reactive to eye opening and eye closing. Sleep was characterized by vertex waves, sleep spindles (12 to 14 Hz), maximal frontocentral region. Hyperventilation and photic stimulation were not performed.     IMPRESSION: This study is within normal limits. No seizures or epileptiform discharges were seen throughout the recording.  A normal interictal EEG does not exclude the diagnosis of epilepsy.  [name]

## 2022-10-06 NOTE — ED Notes (Signed)
2mg ativan
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
## 2022-10-06 NOTE — Progress Notes (Signed)
STAT EEG complete - results pending. ? ?

## 2023-06-20 ENCOUNTER — Emergency Department (HOSPITAL_COMMUNITY)
Admission: EM | Admit: 2023-06-20 | Discharge: 2023-06-21 | Discharge status: Against Medical Advice | Payer: 59 | Attending: Emergency Medicine | Admitting: Emergency Medicine

## 2023-06-20 ENCOUNTER — Encounter (HOSPITAL_COMMUNITY): Payer: Self-pay | Admitting: Emergency Medicine

## 2023-06-20 ENCOUNTER — Other Ambulatory Visit: Payer: Self-pay

## 2023-06-20 ENCOUNTER — Emergency Department (HOSPITAL_COMMUNITY): Payer: Self-pay

## 2023-06-20 DIAGNOSIS — Z5321 Procedure and treatment not carried out due to patient leaving prior to being seen by health care provider: Secondary | ICD-10-CM | POA: Diagnosis not present

## 2023-06-20 DIAGNOSIS — M25571 Pain in right ankle and joints of right foot: Secondary | ICD-10-CM | POA: Diagnosis present

## 2023-06-20 DIAGNOSIS — W010XXA Fall on same level from slipping, tripping and stumbling without subsequent striking against object, initial encounter: Secondary | ICD-10-CM | POA: Diagnosis not present

## 2023-06-20 NOTE — ED Triage Notes (Signed)
 Patient BIB EMS from home c/o fall. Per report patient slip and twisted her right ankle in ice in the driveway. Patient denies LOC. Patient denies hitting her head in the floor. Patient a/ox4.

## 2023-10-13 ENCOUNTER — Other Ambulatory Visit: Payer: Self-pay

## 2023-10-13 ENCOUNTER — Encounter (HOSPITAL_BASED_OUTPATIENT_CLINIC_OR_DEPARTMENT_OTHER): Payer: Self-pay | Admitting: Orthopaedic Surgery

## 2023-10-19 NOTE — Anesthesia Preprocedure Evaluation (Signed)
 Anesthesia Evaluation  Patient identified by MRN, date of birth, ID band Patient awake    Reviewed: Allergy & Precautions, NPO status , Patient's Chart, lab work & pertinent test results  Airway Mallampati: II  TM Distance: >3 FB Neck ROM: Full    Dental  (+) Missing, Dental Advisory Given   Pulmonary neg pulmonary ROS, neg recent URI   Pulmonary exam normal breath sounds clear to auscultation       Cardiovascular negative cardio ROS Normal cardiovascular exam Rhythm:Regular Rate:Normal     Neuro/Psych negative neurological ROS     GI/Hepatic negative GI ROS, Neg liver ROS,neg GERD  ,,  Endo/Other  negative endocrine ROS    Renal/GU negative Renal ROS     Musculoskeletal negative musculoskeletal ROS (+)    Abdominal  (+) + obese  Peds  Hematology  (+) Blood dyscrasia, anemia   Anesthesia Other Findings   Reproductive/Obstetrics                             Anesthesia Physical Anesthesia Plan  ASA: 2  Anesthesia Plan: General   Post-op Pain Management: Tylenol  PO (pre-op)* and Regional block*   Induction: Intravenous  PONV Risk Score and Plan: 3 and Ondansetron , Dexamethasone, Treatment may vary due to age or medical condition and Midazolam  Airway Management Planned: Oral ETT  Additional Equipment:   Intra-op Plan:   Post-operative Plan: Extubation in OR  Informed Consent: I have reviewed the patients History and Physical, chart, labs and discussed the procedure including the risks, benefits and alternatives for the proposed anesthesia with the patient or authorized representative who has indicated his/her understanding and acceptance.     Dental advisory given  Plan Discussed with: CRNA  Anesthesia Plan Comments:         Anesthesia Quick Evaluation

## 2023-10-19 NOTE — H&P (Signed)
 PREOPERATIVE H&P  Chief Complaint: CARTILAGE DISORDER, OA, BURSITIS,BICEPS TENDONITIS, ADHESIVE CAPSULITIS.  HPI: [name] is a 58 y.o. female who is scheduled for, Procedure(s): ARTHROSCOPY, SHOULDER WITH DEBRIDEMENT DECOMPRESSION, SUBACROMIAL SPACE TENODESIS, BICEPS.   Patient has a past medical history significant for anemia.   Patient has had left shoulder pain for quite some time. She has tried physical therapy and injections and not made any progress.   Symptoms are rated as moderate to severe, and have been worsening.  This is significantly impairing activities of daily living.    Please see clinic note for further details on this patient's care.    She has elected for surgical management.   Past Medical History:  Diagnosis Date   Anemia    Past Surgical History:  Procedure Laterality Date   BACK SURGERY     TONSILLECTOMY     Social History   Socioeconomic History   Marital status: Married    Spouse name: Not on file   Number of children: Not on file   Years of education: Not on file   Highest education level: Not on file  Occupational History   Not on file  Tobacco Use   Smoking status: Never   Smokeless tobacco: Never  Vaping Use   Vaping status: Never Used  Substance and Sexual Activity   Alcohol use: No   Drug use: No   Sexual activity: Yes    Birth control/protection: None  Other Topics Concern   Not on file  Social History Narrative   ** Merged History Encounter **       Social Drivers of Corporate investment banker Strain: Not on file  Food Insecurity: Not on file  Transportation Needs: Not on file  Physical Activity: Not on file  Stress: Not on file  Social Connections: Not on file   Family History  Problem Relation Age of Onset   Diabetes Maternal Grandmother    Other Maternal Grandfather        brain tumor   Diabetes Father    Hypertension Father    Diabetes Mother    Diabetes Sister    Diabetes Sister    Asthma  Daughter    No Known Allergies Prior to Admission medications   Medication Sig Start Date End Date Taking? Authorizing Provider  ibuprofen  (ADVIL ) 800 MG tablet Take 1 tablet (800 mg total) by mouth every 8 (eight) hours as needed. 09/20/22  Yes Sofia, Leslie K, PA-C  acetaminophen  (TYLENOL ) 500 MG tablet Take 500 mg by mouth every 6 (six) hours as needed for headache.    [provider]  aspirin EC 81 MG tablet Take 81 mg by mouth every 4 (four) hours as needed for mild pain. Swallow whole.    [provider]  methocarbamol  (ROBAXIN ) 500 MG tablet Take 1 tablet (500 mg total) by mouth 4 (four) times daily. 09/20/22   Sandi Crosby, PA-C  Semaglutide -Weight Management 1 MG/0.5ML SOAJ Inject 1MG  into the skin weekly 03/08/22     Semaglutide -Weight Management 2.4 MG/0.75ML SOAJ Inject 2.4 mg as directed once a week. 05/21/22     tirzepatide  (ZEPBOUND ) 2.5 MG/0.5ML Pen Inject 2.5 mg into the skin once a week. 06/17/22     tirzepatide  (ZEPBOUND ) 5 MG/0.5ML Pen Inject 5 mg into the skin once a week. 08/19/22     Vitamin D, Ergocalciferol, (DRISDOL) 1.25 MG (50000 UNIT) CAPS capsule Take 50,000 Units by mouth every 7 (seven) days.    [provider]    ROS: All other systems have been reviewed and were otherwise negative with the exception of those mentioned in the HPI and as above.  Physical Exam: General: Alert, no acute distress Cardiovascular: No pedal edema Respiratory: No cyanosis, no use of accessory musculature GI: No organomegaly, abdomen is soft and non-tender Skin: No lesions in the area of chief complaint Neurologic: Sensation intact distally Psychiatric: Patient is competent for consent with normal mood and affect Lymphatic: No axillary or cervical lymphadenopathy  MUSCULOSKELETAL:  Left shoulder: active forward elevation to 80 degrees. Passive to 110. External rotation to 30 degrees. Internal rotation to back pocket. Cuff strength appears to be intact but  she has pain with supraspinatus testing. Positive O'Briens. Positive Impingement. Positive AC tenderness to palpation  Imaging: MRI of the left shoulder demonstrates significant fluid around the biceps, type 2 acromion. Moderate AC joint arthrosis. Small interstitial partial thickness rotator cuff tear   Assessment: CARTILAGE DISORDER, OA, BURSITIS,BICEPS TENDONITIS, ADHESIVE CAPSULITIS.  Plan: Plan for Procedure(s): ARTHROSCOPY, SHOULDER WITH DEBRIDEMENT DECOMPRESSION, SUBACROMIAL SPACE TENODESIS, BICEPS  The risks benefits and alternatives were discussed with the patient including but not limited to the risks of nonoperative treatment, versus surgical intervention including infection, bleeding, nerve injury,  blood clots, cardiopulmonary complications, morbidity, mortality, among others, and they were willing to proceed.   The patient acknowledged the explanation, agreed to proceed with the plan and consent was signed.   Operative Plan: Left shoulder scope with SAD, DCE, BT, lysis of adhesions and manipulation, evaluation of rotator cuff Discharge Medications: standard DVT Prophylaxis: none Physical Therapy: outpatient PT Special Discharge needs: Sling. IceMan   Adine Ahmadi, PA-C  10/19/2023 2:07 PM

## 2023-10-19 NOTE — Discharge Instructions (Signed)
 Grafton Lawrence MD, MPH Nicholas Bari, PA-C [facility] [address] [phone] (tel)   [phone] (fax)   POST-OPERATIVE INSTRUCTIONS - SHOULDER ARTHROSCOPY  WOUND CARE You may remove the Operative Dressing on Post-Op Day #3 (72hrs after surgery).   Alternatively if you would like you can leave dressing on until follow-up if within 7-8 days but keep it dry. Leave steri-strips in place until they fall off on their own, usually 2 weeks postop. There may be a small amount of fluid/bleeding leaking at the surgical site.  This is normal; the shoulder is filled with fluid during the procedure and can leak for 24-48hrs after surgery.  You may change/reinforce the bandage as needed.  Use the Cryocuff or Ice as often as possible for the first 7 days, then as needed for pain relief. Always keep a towel, ACE wrap or other barrier between the cooling unit and your skin.  You may shower on Post-Op Day #3. Gently pat the area dry.  Do not soak the shoulder in water or submerge it.  Keep incisions as dry as possible. Do not go swimming in the pool or ocean until 4 weeks after surgery or when otherwise instructed.    EXERCISES Wear the sling at all times until your nerve block wears off (for about 2-3 days post-op) Then you can remove your sling and use your operative arm as you feel comfortable Do not lift anything heavy with your operative arm  It is normal for your fingers/hand to become more swollen after surgery and discolored from bruising.   This will resolve over the first few weeks usually after surgery. Please continue to ambulate and do not stay sitting or lying for too long.  Perform foot and wrist pumps to assist in circulation.  PHYSICAL THERAPY - You will begin physical therapy soon after surgery (unless otherwise specified) - You will need to begin physical therapy ASAP - Please call to set up an appointment, if you do not already have one  -  Let our office if there are any issues with scheduling your therapy    REGIONAL ANESTHESIA (NERVE BLOCKS) The anesthesia team may have performed a nerve block for you this is a great tool used to minimize pain.   The block may start wearing off overnight (between 8-24 hours postop) When the block wears off, your pain may go from nearly zero to the pain you would have had postop without the block. This is an abrupt transition but nothing dangerous is happening.   This can be a challenging period but utilize your as needed pain medications to try and manage this period. We suggest you use the pain medication the first night prior to going to bed, to ease this transition.  You may take an extra dose of narcotic when this happens if needed  POST-OP MEDICATIONS- Multimodal approach to pain control In general your pain will be controlled with a combination of substances.  Prescriptions unless otherwise discussed are electronically sent to your pharmacy.  This is a carefully made plan we use to minimize narcotic use.     Celebrex - Anti-inflammatory medication taken on a scheduled basis Acetaminophen  - Non-narcotic pain medicine taken on a scheduled basis  Gabapentin - this is to help with nerve based pain, take on a scheduled basis Oxycodone - This is a strong narcotic, to be used only on an "as needed" basis for SEVERE pain. Zofran  - take as needed for nausea  FOLLOW-UP If you develop a Fever (>=101.5), Redness or Drainage from the surgical incision site, please call our office to arrange for an evaluation. Please call the office to schedule a follow-up appointment for your first post-operative appointment, 7-10 days post-operatively.    HELPFUL INFORMATION   You may be more comfortable sleeping in a semi-seated position the first few nights following surgery.  Keep a pillow propped under the elbow and forearm for comfort.  If you have a recliner type of chair it might be beneficial.  If  not that is fine too, but it would be helpful to sleep propped up with pillows behind your operated shoulder as well under your elbow and forearm.  This will reduce pulling on the suture lines.  When dressing, put your operative arm in the sleeve first.  When getting undressed, take your operative arm out last.  Loose fitting, button-down shirts are recommended.  Often in the first days after surgery you may be more comfortable keeping your operative arm under your shirt and not through the sleeve.  You may return to work/school in the next couple of days when you feel up to it.  Desk work and typing in the sling is fine.  We suggest you use the pain medication the first night prior to going to bed, in order to ease any pain when the anesthesia wears off. You should avoid taking pain medications on an empty stomach as it will make you nauseous.  You should wean off your narcotic medicines as soon as you are able.  Most patients will be off narcotics before their first postop appointment.   Do not drink alcoholic beverages or take illicit drugs when taking pain medications.  It is against the law to drive while taking narcotics.  In some states it is against the law to drive while your arm is in a sling.   Pain medication may make you constipated.  Below are a few solutions to try in this order: Decrease the amount of pain medication if you aren't having pain. Drink lots of decaffeinated fluids. Drink prune juice and/or eat dried prunes  If the first 3 don't work start with additional solutions Take Colace - an over-the-counter stool softener Take Senokot - an over-the-counter laxative Take Miralax - a stronger over-the-counter laxative  For more information including helpful videos and documents visit our website:   [email or web address]   Information for Discharge Teaching: EXPAREL (bupivacaine liposome injectable suspension)   Pain relief is important  to your recovery. The goal is to control your pain so you can move easier and return to your normal activities as soon as possible after your procedure. Your physician may use several types of medicines to manage pain, swelling, and more.  Your surgeon or anesthesiologist gave you EXPAREL(bupivacaine) to help control your pain after surgery.  EXPAREL is a local anesthetic designed to release slowly over an extended period of time to provide pain relief by numbing the tissue around the surgical site. EXPAREL is designed to release pain medication over time and can control pain for up to 72 hours. Depending on how you respond to EXPAREL, you may require less pain medication during your recovery. EXPAREL can help reduce or eliminate the need for opioids during the first few days after surgery when pain relief is needed the most. EXPAREL is not an opioid and is not addictive. It does not cause sleepiness or sedation.   Important! A teal colored band has been placed on your arm  with the date, time and amount of EXPAREL you have received. Please leave this armband in place for the full 96 hours following administration, and then you may remove the band. If you return to the hospital for any reason within 96 hours following the administration of EXPAREL, the armband provides important information that your health care providers to know, and alerts them that you have received this anesthetic.    Possible side effects of EXPAREL: Temporary loss of sensation or ability to move in the area where medication was injected. Nausea, vomiting, constipation Rarely, numbness and tingling in your mouth or lips, lightheadedness, or anxiety may occur. Call your doctor right away if you think you may be experiencing any of these sensations, or if you have other questions regarding possible side effects.  Follow all other discharge instructions given to you by your surgeon or nurse. Eat a healthy diet and drink plenty of  water or other fluids. Post Anesthesia Home Care Instructions  Activity: Get plenty of rest for the remainder of the day. A responsible individual must stay with you for 24 hours following the procedure.  For the next 24 hours, DO NOT: -Drive a car -Advertising copywriter -Drink alcoholic beverages -Take any medication unless instructed by your physician -Make any legal decisions or sign important papers.  Meals: Start with liquid foods such as gelatin or soup. Progress to regular foods as tolerated. Avoid greasy, spicy, heavy foods. If nausea and/or vomiting occur, drink only clear liquids until the nausea and/or vomiting subsides. Call your physician if vomiting continues.  Special Instructions/Symptoms: Your throat may feel dry or sore from the anesthesia or the breathing tube placed in your throat during surgery. If this causes discomfort, gargle with warm salt water. The discomfort should disappear within 24 hours.

## 2023-10-20 ENCOUNTER — Ambulatory Visit (HOSPITAL_BASED_OUTPATIENT_CLINIC_OR_DEPARTMENT_OTHER): Payer: Self-pay | Admitting: Anesthesiology

## 2023-10-20 ENCOUNTER — Encounter (HOSPITAL_BASED_OUTPATIENT_CLINIC_OR_DEPARTMENT_OTHER): Payer: Self-pay | Admitting: Orthopaedic Surgery

## 2023-10-20 ENCOUNTER — Other Ambulatory Visit: Payer: Self-pay

## 2023-10-20 ENCOUNTER — Encounter (HOSPITAL_BASED_OUTPATIENT_CLINIC_OR_DEPARTMENT_OTHER)
Admission: RE | Disposition: A | Discharge status: Home / Self Care | Payer: Self-pay | Source: Home / Self Care | Attending: Orthopaedic Surgery

## 2023-10-20 ENCOUNTER — Ambulatory Visit (HOSPITAL_BASED_OUTPATIENT_CLINIC_OR_DEPARTMENT_OTHER)
Admission: RE | Admit: 2023-10-20 | Discharge: 2023-10-20 | Disposition: A | Discharge status: Home / Self Care | Attending: Orthopaedic Surgery | Admitting: Orthopaedic Surgery

## 2023-10-20 DIAGNOSIS — M25812 Other specified joint disorders, left shoulder: Secondary | ICD-10-CM | POA: Diagnosis not present

## 2023-10-20 DIAGNOSIS — M19012 Primary osteoarthritis, left shoulder: Secondary | ICD-10-CM | POA: Insufficient documentation

## 2023-10-20 DIAGNOSIS — M7552 Bursitis of left shoulder: Secondary | ICD-10-CM | POA: Diagnosis not present

## 2023-10-20 DIAGNOSIS — M24112 Other articular cartilage disorders, left shoulder: Secondary | ICD-10-CM | POA: Diagnosis present

## 2023-10-20 DIAGNOSIS — S43432A Superior glenoid labrum lesion of left shoulder, initial encounter: Secondary | ICD-10-CM | POA: Insufficient documentation

## 2023-10-20 DIAGNOSIS — M7522 Bicipital tendinitis, left shoulder: Secondary | ICD-10-CM | POA: Diagnosis not present

## 2023-10-20 DIAGNOSIS — X58XXXA Exposure to other specified factors, initial encounter: Secondary | ICD-10-CM | POA: Diagnosis not present

## 2023-10-20 HISTORY — PX: POSTERIOR LUMBAR FUSION 2 WITH HARDWARE REMOVAL: SHX7297

## 2023-10-20 HISTORY — PX: SUBACROMIAL DECOMPRESSION: SHX5174

## 2023-10-20 HISTORY — PX: BICEPT TENODESIS: SHX5116

## 2023-10-20 SURGERY — ARTHROSCOPY, SHOULDER WITH DEBRIDEMENT
Anesthesia: General | Laterality: Left

## 2023-10-20 MED ORDER — LIDOCAINE 2% (20 MG/ML) 5 ML SYRINGE
INTRAMUSCULAR | Status: DC | PRN
  Administered 2023-10-20: 80 mg via INTRAVENOUS

## 2023-10-20 MED ORDER — DEXAMETHASONE SODIUM PHOSPHATE 10 MG/ML IJ SOLN
INTRAMUSCULAR | Status: DC | PRN
  Administered 2023-10-20: 10 mg via INTRAVENOUS

## 2023-10-20 MED ORDER — ONDANSETRON HCL 4 MG/2ML IJ SOLN
INTRAMUSCULAR | Status: AC
  Filled 2023-10-20: qty 2

## 2023-10-20 MED ORDER — CEFAZOLIN SODIUM-DEXTROSE 2-4 GM/100ML-% IV SOLN
2.0000 g | INTRAVENOUS | Status: AC
  Administered 2023-10-20: 2 g via INTRAVENOUS

## 2023-10-20 MED ORDER — FENTANYL CITRATE (PF) 100 MCG/2ML IJ SOLN
50.0000 ug | Freq: Once | INTRAMUSCULAR | Status: AC
  Administered 2023-10-20: 50 ug via INTRAVENOUS

## 2023-10-20 MED ORDER — ACETAMINOPHEN 500 MG PO TABS: 1000.0000 mg | ORAL_TABLET | Freq: Three times a day (TID) | ORAL | 0 refills | Status: AC

## 2023-10-20 MED ORDER — FENTANYL CITRATE (PF) 100 MCG/2ML IJ SOLN: 25.0000 ug | INTRAMUSCULAR | Status: DC | PRN

## 2023-10-20 MED ORDER — LACTATED RINGERS IV SOLN: INTRAVENOUS | Status: DC

## 2023-10-20 MED ORDER — OXYCODONE HCL 5 MG PO TABS: ORAL_TABLET | ORAL | 0 refills | Status: AC

## 2023-10-20 MED ORDER — GABAPENTIN 100 MG PO CAPS
100.0000 mg | ORAL_CAPSULE | Freq: Three times a day (TID) | ORAL | 0 refills | Status: AC
Start: 2023-10-20 — End: 2023-11-03

## 2023-10-20 MED ORDER — LIDOCAINE 2% (20 MG/ML) 5 ML SYRINGE
INTRAMUSCULAR | Status: AC
  Filled 2023-10-20: qty 5

## 2023-10-20 MED ORDER — ACETAMINOPHEN 500 MG PO TABS: 1000.0000 mg | ORAL_TABLET | Freq: Once | ORAL | Status: DC

## 2023-10-20 MED ORDER — EPHEDRINE 5 MG/ML INJ
INTRAVENOUS | Status: AC
  Filled 2023-10-20: qty 5

## 2023-10-20 MED ORDER — TRANEXAMIC ACID-NACL 1000-0.7 MG/100ML-% IV SOLN
1000.0000 mg | INTRAVENOUS | Status: AC
  Administered 2023-10-20: 1000 mg via INTRAVENOUS

## 2023-10-20 MED ORDER — ROCURONIUM BROMIDE 10 MG/ML (PF) SYRINGE
PREFILLED_SYRINGE | INTRAVENOUS | Status: AC
  Filled 2023-10-20: qty 10

## 2023-10-20 MED ORDER — PROPOFOL 10 MG/ML IV BOLUS
INTRAVENOUS | Status: AC
  Filled 2023-10-20: qty 20

## 2023-10-20 MED ORDER — GABAPENTIN 300 MG PO CAPS
ORAL_CAPSULE | ORAL | Status: AC
  Filled 2023-10-20: qty 1

## 2023-10-20 MED ORDER — ACETAMINOPHEN 500 MG PO TABS
ORAL_TABLET | ORAL | Status: AC
  Filled 2023-10-20: qty 2

## 2023-10-20 MED ORDER — FENTANYL CITRATE (PF) 100 MCG/2ML IJ SOLN
INTRAMUSCULAR | Status: AC
  Filled 2023-10-20: qty 2

## 2023-10-20 MED ORDER — ACETAMINOPHEN 500 MG PO TABS
1000.0000 mg | ORAL_TABLET | Freq: Once | ORAL | Status: AC
  Administered 2023-10-20: 1000 mg via ORAL

## 2023-10-20 MED ORDER — ONDANSETRON HCL 4 MG/2ML IJ SOLN
INTRAMUSCULAR | Status: DC | PRN
  Administered 2023-10-20: 4 mg via INTRAVENOUS

## 2023-10-20 MED ORDER — SUGAMMADEX SODIUM 200 MG/2ML IV SOLN
INTRAVENOUS | Status: DC | PRN
  Administered 2023-10-20: 200 mg via INTRAVENOUS

## 2023-10-20 MED ORDER — PROPOFOL 10 MG/ML IV BOLUS
INTRAVENOUS | Status: DC | PRN
Start: 2023-10-20 — End: 2023-10-20
  Administered 2023-10-20: 130 mg via INTRAVENOUS

## 2023-10-20 MED ORDER — CEFAZOLIN SODIUM-DEXTROSE 2-4 GM/100ML-% IV SOLN
INTRAVENOUS | Status: AC
  Filled 2023-10-20: qty 100

## 2023-10-20 MED ORDER — ONDANSETRON HCL 4 MG PO TABS: 4.0000 mg | ORAL_TABLET | Freq: Three times a day (TID) | ORAL | 0 refills | Status: AC | PRN

## 2023-10-20 MED ORDER — FENTANYL CITRATE (PF) 250 MCG/5ML IJ SOLN
INTRAMUSCULAR | Status: DC | PRN
  Administered 2023-10-20: 100 ug via INTRAVENOUS

## 2023-10-20 MED ORDER — MIDAZOLAM HCL 2 MG/2ML IJ SOLN
1.0000 mg | Freq: Once | INTRAMUSCULAR | Status: AC
  Administered 2023-10-20: 1 mg via INTRAVENOUS

## 2023-10-20 MED ORDER — TRANEXAMIC ACID-NACL 1000-0.7 MG/100ML-% IV SOLN
INTRAVENOUS | Status: AC
  Filled 2023-10-20: qty 100

## 2023-10-20 MED ORDER — DEXAMETHASONE SODIUM PHOSPHATE 10 MG/ML IJ SOLN
INTRAMUSCULAR | Status: AC
  Filled 2023-10-20: qty 1

## 2023-10-20 MED ORDER — FENTANYL CITRATE (PF) 100 MCG/2ML IJ SOLN
INTRAMUSCULAR | Status: AC
Start: 2023-10-20 — End: ?
  Filled 2023-10-20: qty 2

## 2023-10-20 MED ORDER — MIDAZOLAM HCL 2 MG/2ML IJ SOLN
INTRAMUSCULAR | Status: AC
  Filled 2023-10-20: qty 2

## 2023-10-20 MED ORDER — ROCURONIUM BROMIDE 10 MG/ML (PF) SYRINGE
PREFILLED_SYRINGE | INTRAVENOUS | Status: DC | PRN
  Administered 2023-10-20: 40 mg via INTRAVENOUS

## 2023-10-20 MED ORDER — CELECOXIB 100 MG PO CAPS: 100.0000 mg | ORAL_CAPSULE | Freq: Two times a day (BID) | ORAL | 0 refills | Status: AC

## 2023-10-20 MED ORDER — GABAPENTIN 300 MG PO CAPS
300.0000 mg | ORAL_CAPSULE | Freq: Once | ORAL | Status: AC
  Administered 2023-10-20: 300 mg via ORAL

## 2023-10-20 MED ORDER — DROPERIDOL 2.5 MG/ML IJ SOLN: 0.6250 mg | Freq: Once | INTRAMUSCULAR | Status: DC | PRN

## 2023-10-20 MED ORDER — EPHEDRINE SULFATE (PRESSORS) 50 MG/ML IJ SOLN
INTRAMUSCULAR | Status: DC | PRN
  Administered 2023-10-20: 5 mg via INTRAVENOUS

## 2023-10-20 MED ORDER — SODIUM CHLORIDE 0.9 % IR SOLN
Status: DC | PRN
  Administered 2023-10-20: 60000 mL

## 2023-10-20 SURGICAL SUPPLY — 46 items
ANCHOR SUT 1.8 FIBERTAK SB KL (Anchor) IMPLANT
BLADE EXCALIBUR 4.0X13 (MISCELLANEOUS) ×2 IMPLANT
BURR OVAL 8 FLU 4.0X13 (MISCELLANEOUS) ×2 IMPLANT
CANNULA 5.75X71 LONG (CANNULA) IMPLANT
CANNULA PASSPORT 5 (CANNULA) IMPLANT
CANNULA PASSPORT BUTTON 10-40 (CANNULA) IMPLANT
CANNULA TWIST IN 8.25X7CM (CANNULA) IMPLANT
CHLORAPREP W/TINT 26 (MISCELLANEOUS) ×2 IMPLANT
CLSR STERI-STRIP ANTIMIC 1/2X4 (GAUZE/BANDAGES/DRESSINGS) ×2 IMPLANT
COOLER ICEMAN CLASSIC (MISCELLANEOUS) ×2 IMPLANT
DRAPE IMP U-DRAPE 54X76 (DRAPES) ×2 IMPLANT
DRAPE INCISE IOBAN 66X45 STRL (DRAPES) IMPLANT
DRAPE SHOULDER BEACH CHAIR (DRAPES) ×2 IMPLANT
DW OUTFLOW CASSETTE/TUBE SET (MISCELLANEOUS) ×2 IMPLANT
GAUZE PAD ABD 8X10 STRL (GAUZE/BANDAGES/DRESSINGS) ×2 IMPLANT
GAUZE SPONGE 4X4 12PLY STRL (GAUZE/BANDAGES/DRESSINGS) ×2 IMPLANT
GLOVE BIO SURGEON STRL SZ 6.5 (GLOVE) ×2 IMPLANT
GLOVE BIOGEL PI IND STRL 6.5 (GLOVE) ×2 IMPLANT
GLOVE BIOGEL PI IND STRL 8 (GLOVE) ×2 IMPLANT
GLOVE ECLIPSE 8.0 STRL XLNG CF (GLOVE) ×2 IMPLANT
GOWN STRL REUS W/ TWL LRG LVL3 (GOWN DISPOSABLE) ×4 IMPLANT
GOWN STRL REUS W/TWL XL LVL3 (GOWN DISPOSABLE) ×2 IMPLANT
KIT STABILIZATION SHOULDER (MISCELLANEOUS) ×2 IMPLANT
KIT STR SPEAR 1.8 FBRTK DISP (KITS) IMPLANT
LASSO CRESCENT QUICKPASS (SUTURE) IMPLANT
MANIFOLD NEPTUNE II (INSTRUMENTS) ×2 IMPLANT
NDL HD SCORPION MEGA LOADER (NEEDLE) IMPLANT
NDL SAFETY ECLIPSE 18X1.5 (NEEDLE) ×2 IMPLANT
PACK ARTHROSCOPY DSU (CUSTOM PROCEDURE TRAY) ×2 IMPLANT
PACK BASIN DAY SURGERY FS (CUSTOM PROCEDURE TRAY) ×2 IMPLANT
PAD COLD SHLDR WRAP-ON (PAD) ×2 IMPLANT
RESTRAINT HEAD UNIVERSAL NS (MISCELLANEOUS) ×2 IMPLANT
SHEET MEDIUM DRAPE 40X70 STRL (DRAPES) ×2 IMPLANT
SLEEVE SCD COMPRESS KNEE MED (STOCKING) ×2 IMPLANT
SLING ARM FOAM STRAP LRG (SOFTGOODS) IMPLANT
SUT MNCRL AB 4-0 PS2 18 (SUTURE) ×2 IMPLANT
SUT PDS AB 0 CT 36 (SUTURE) IMPLANT
SUT TIGER TAPE 7 IN WHITE (SUTURE) IMPLANT
SUTURE FIBERWR #2 38 T-5 BLUE (SUTURE) IMPLANT
SUTURE TAPE TIGERLINK 1.3MM BL (SUTURE) IMPLANT
SYR 5ML LL (SYRINGE) ×2 IMPLANT
TAPE FIBER 2MM 7IN #2 BLUE (SUTURE) IMPLANT
TOWEL GREEN STERILE FF (TOWEL DISPOSABLE) ×4 IMPLANT
TUBE CONNECTING 20X1/4 (TUBING) ×2 IMPLANT
TUBING ARTHROSCOPY IRRIG 16FT (MISCELLANEOUS) ×2 IMPLANT
WAND ABLATOR APOLLO I90 (BUR) ×2 IMPLANT

## 2023-10-20 NOTE — Transfer of Care (Signed)
 Immediate Anesthesia Transfer of Care Note  Patient: [name]  Procedure(s) Performed: ARTHROSCOPY, SHOULDER WITH DEBRIDEMENT (Left) DECOMPRESSION, SUBACROMIAL SPACE (Left) TENODESIS, BICEPS (Left)  Patient Location: PACU  Anesthesia Type:General  Level of Consciousness: drowsy  Airway & Oxygen Therapy: Patient Spontanous Breathing and Patient connected to face mask oxygen  Post-op Assessment: Report given to RN and Post -op Vital signs reviewed and stable  Post vital signs: Reviewed and stable  Last Vitals:  Vitals Value Taken Time  BP 133/81 10/20/23 0820  Temp    Pulse 59 10/20/23 0822  Resp 12 10/20/23 0822  SpO2 100 % 10/20/23 0822  Vitals shown include unfiled device data.  Last Pain:  Vitals:   10/20/23 0711  TempSrc:   PainSc: 0-No pain         Complications: No notable events documented.

## 2023-10-20 NOTE — Anesthesia Postprocedure Evaluation (Signed)
 Anesthesia Post Note  Patient: [name]  Procedure(s) Performed: ARTHROSCOPY, SHOULDER WITH DEBRIDEMENT (Left) DECOMPRESSION, SUBACROMIAL SPACE (Left) TENODESIS, BICEPS (Left)     Patient location during evaluation: PACU Anesthesia Type: General Level of consciousness: sedated and patient cooperative Pain management: pain level controlled Vital Signs Assessment: post-procedure vital signs reviewed and stable Respiratory status: spontaneous breathing Cardiovascular status: stable Anesthetic complications: no   No notable events documented.  Last Vitals:  Vitals:   10/20/23 0845 10/20/23 0900  BP: (!) 144/84 (!) 143/93  Pulse: (!) 55 65  Resp: 16 18  Temp:  (!) 36.2 C  SpO2: 98% 98%    Last Pain:  Vitals:   10/20/23 0900  TempSrc: Temporal  PainSc: 0-No pain                 Gorman Laughter

## 2023-10-20 NOTE — Interval H&P Note (Signed)
 All questions answered, patient wants to proceed with procedure. ? ?

## 2023-10-20 NOTE — Progress Notes (Signed)
Assisted Dr. Lissa Hoard with left, interscalene , ultrasound guided block. Side rails up, monitors on throughout procedure. See vital signs in flow sheet. Tolerated Procedure well.

## 2023-10-20 NOTE — Anesthesia Procedure Notes (Signed)
 Procedure Name: Intubation Date/Time: 10/20/2023 7:27 AM  Performed by: Adolphus Hoops, CRNAPre-anesthesia Checklist: Patient identified, Emergency Drugs available, Suction available and Patient being monitored Patient Re-evaluated:Patient Re-evaluated prior to induction Oxygen Delivery Method: Circle System Utilized Preoxygenation: Pre-oxygenation with 100% oxygen Induction Type: IV induction Ventilation: Mask ventilation without difficulty Laryngoscope Size: Mac and 3 Grade View: Grade II Tube type: Oral Tube size: 7.0 mm Number of attempts: 1 Airway Equipment and Method: Stylet and Oral airway Placement Confirmation: ETT inserted through vocal cords under direct vision, positive ETCO2 and breath sounds checked- equal and bilateral Secured at: 21 cm Tube secured with: Tape Dental Injury: Teeth and Oropharynx as per pre-operative assessment

## 2023-10-20 NOTE — Op Note (Signed)
 Orthopaedic Surgery Operative Note (CSN: [id number])  [name]  21-Jan-1966 Date of Surgery: 10/20/2023   DIAGNOSES: Left shoulder, SLAP tear, biceps tendinitis, AC arthritis, and subacromial impingement.  POST-OPERATIVE DIAGNOSIS: same  PROCEDURE: Arthroscopic extensive debridement - 29823 Subdeltoid Bursa, Supraspinatus Tendon, Anterior Labrum, Superior Labrum, and Posterior Labrum Arthroscopic distal clavicle excision - 34742 Arthroscopic subacromial decompression - 59563 Arthroscopic biceps tenodesis - 87564 Arthroscopic lysis of adhesion and manipulation anesthesia   OPERATIVE FINDING: Patient had limitations with external rotation to 30 degrees, forward flexion to 120 degrees, postoperatively after release we had full motion of the shoulder.  Upper border of subscapularis was normal however there was a type II SLAP tear and there was a leading edge supraspinatus and we felt that a biceps tenodesis was appropriate.  Cartilage surfaces intact, anterior posterior labral tearing was noted, thickening of the rotator interval with redness and injection as well as thickening of the inferior capsule was noted.  Release was performed of the inferior 180 degrees of the capsule.  Biceps tenodesis with a perforating technique performed.     Post-operative plan: The patient will be non-weightbearing in a sling for 1 to 2 days until comfortable and then will be discharged from sling early with aggressive therapy.  The patient will be discharged home.  DVT prophylaxis not indicated in ambulatory upper extremity patient without known risk factors.   Pain control with PRN pain medication preferring oral medicines.  Follow up plan will be scheduled in approximately 7 days for incision check and XR.  Surgeons:Primary: Micheline Ahr, MD Assistants:Caroline McBane, PA-C Location: [facility] OR ROOM 1 Anesthesia: General with Exparel interscalene block Antibiotics: Ancef 2 g Tourniquet time:  None Estimated Blood Loss: Minimal Complications: None Specimens: None Implants: Implant Name Type Inv. Item Serial No. Manufacturer Lot No. LRB No. Used Action  ANCHOR SUT 1.8 FIBERTAK SB KL - [id number] Anchor ANCHOR SUT 1.8 FIBERTAK SB KL  [employer] [id number] Left 1 Implanted  ANCHOR SUT 1.8 FIBERTAK SB KL - [id number] Anchor ANCHOR SUT 1.8 FIBERTAK SB KL  [employer] [id number] Left 1 Implanted    Indications for Surgery:   [name] is a 58 y.o. female with continued shoulder pain refractory to nonoperative measures for extended period of time.    The risks and benefits were explained at length including but not limited to continued pain, cuff failure, biceps tenodesis failure, stiffness, need for further surgery and infection.   Procedure:   Patient was correctly identified in the preoperative holding area and operative site marked.  Patient brought to OR and positioned beachchair on an [place]ier table ensuring that all bony prominences were padded and the head was in an appropriate location.  Anesthesia was induced and the operative shoulder was prepped and draped in the usual sterile fashion.  Timeout was called preincision.  A standard posterior viewing portal was made after localizing the portal with a spinal needle.  An anterior accessory portal was also made.  After clearing the articular space the camera was positioned in the subacromial space.  Findings above.    Extensive debridement was performed of the anterior interval tissue, labral fraying and the bursa.  Glenoid bone, glenoid cartilage, humeral bone were all debrided.  Subacromial decompression: We made a lateral portal with spinal needle guidance. We then proceeded to debride bursal tissue extensively with a shaver and arthrocare device. At that point we continued to identify the borders of the acromion and identify the spur. We then carefully preserved  the deltoid fascia and used a burr to convert the acromion to a Type 1  flat acromion without issue.  Biceps tenodesis: We marked the tendon and then performed a tenotomy and debridement of the stump in the articular space. We then identified the biceps tendon in its groove suprapec with the arthroscope in the lateral portal taking care to move from lateral to medial to avoid injury to the subscapularis. At that point we unroofed the tendon itself and mobilized it. An accessory anterior portal was made in line with the tendon and we grasped it from the anterior superior portal and worked from the accessory anterior portal. Two Fibertak 1.61mm knotless anchors were placed in the groove and the tendon was secured in a luggage loop style fashion with a pass of the limb of suture through the tendon using a scorpion device to avoid pull-through.  Repair was completed with good tension on the tendon.  Residual stump of the tendon was removed after being resected with a RF ablator.  Biceps tenodesis: We marked the tendon and then performed a tenotomy and debridement of the stump in the articular space. We then identified the biceps tendon in its groove suprapec with the arthroscope in the lateral portal taking care to move from lateral to medial to avoid injury to the subscapularis. At that point we unroofed the tendon itself and mobilized it. An accessory anterior portal was made in line with the tendon and we grasped it from the anterior superior portal and worked from the accessory anterior portal. Two Fibertak 1.60mm knotless anchors were placed in the groove and the tendon was secured in a luggage loop style fashion with a pass of the limb of suture through the tendon using a scorpion device to avoid pull-through.  Repair was completed with good tension on the tendon.  Residual stump of the tendon was removed after being resected with a RF ablator.  We identified the thickened MGHL and released this with a RF ablator.  We then used the RF ablator to skeletonize the anterior interval.   Once was complete we used a combination of the RF ablator as well as the basket device to complete a capsular release along the inferior half of the glenoid just off the glenoid labrum.  We stayed close to the labrum to avoid neurovascular damage.  At that point we removed all instruments and were able to manipulate the shoulder and demonstrate that there was improvement in range of motion as documented above.  We placed the scope back into the shoulder to ensure that there were no damage to the cuff or other structures in the shoulder.   The incisions were closed with absorbable monocryl and steri strips.  A sterile dressing was placed along with a sling. The patient was awoken from general anesthesia and taken to the PACU in stable condition without complication.   Nicholas Bari, PA-C, present and scrubbed throughout the case, critical for completion in a timely fashion, and for retraction, instrumentation, closure.

## 2023-10-21 ENCOUNTER — Encounter (HOSPITAL_BASED_OUTPATIENT_CLINIC_OR_DEPARTMENT_OTHER): Payer: Self-pay | Admitting: Orthopaedic Surgery

## 2023-11-02 MED ORDER — BUPIVACAINE HCL (PF) 0.5 % IJ SOLN
INTRAMUSCULAR | Status: DC | PRN
  Administered 2023-10-20: 10 mL via PERINEURAL

## 2023-11-02 MED ORDER — BUPIVACAINE LIPOSOME 1.3 % IJ SUSP
INTRAMUSCULAR | Status: DC | PRN
  Administered 2023-10-20: 10 mL via PERINEURAL

## 2023-11-02 NOTE — Addendum Note (Signed)
 Addendum  created 11/02/23 1827 by Gorman Laughter, MD   Child order released for a procedure order, Clinical Note Signed, Intraprocedure Blocks edited, Intraprocedure Meds edited, SmartForm saved

## 2023-11-02 NOTE — Anesthesia Procedure Notes (Signed)
 Anesthesia Regional Block: Interscalene brachial plexus block   Pre-Anesthetic Checklist: , timeout performed,  Correct Patient, Correct Site, Correct Laterality,  Correct Procedure, Correct Position, site marked,  Risks and benefits discussed,  Surgical consent,  Pre-op evaluation,  At surgeon's request and post-op pain management  Laterality: Upper and Left  Prep: chloraprep       Needles:  Injection technique: Single-shot  Needle Type: Stimulator Needle - 40     Needle Length: 4cm  Needle Gauge: 22     Additional Needles:   Procedures:,,,, ultrasound used (permanent image in chart),,    Narrative:  Start time: 11/02/2023 6:49 AM End time: 11/02/2023 7:09 AM Injection made incrementally with aspirations every 5 mL.  Performed by: Personally  Anesthesiologist: Gorman Laughter, MD  Additional Notes: BP cuff, SpO2 and EKG monitors applied. Sedation begun. Nerve location verified with ultrasound. Anesthetic injected incrementally, slowly, and after neg aspirations under direct u/s guidance. Good perineural spread. Tolerated well.

## 2024-04-12 ENCOUNTER — Encounter: Payer: Self-pay | Admitting: Family Medicine

## 2024-04-12 ENCOUNTER — Other Ambulatory Visit: Payer: Self-pay | Admitting: Family Medicine

## 2024-04-12 DIAGNOSIS — R310 Gross hematuria: Secondary | ICD-10-CM

## 2024-04-12 DIAGNOSIS — R109 Unspecified abdominal pain: Secondary | ICD-10-CM

## 2024-04-18 ENCOUNTER — Ambulatory Visit
Admission: RE | Admit: 2024-04-18 | Discharge: 2024-04-18 | Disposition: A | Discharge status: Home / Self Care | Source: Ambulatory Visit | Attending: Family Medicine | Admitting: Family Medicine

## 2024-04-18 DIAGNOSIS — R310 Gross hematuria: Secondary | ICD-10-CM

## 2024-04-18 DIAGNOSIS — R109 Unspecified abdominal pain: Secondary | ICD-10-CM

## 2024-04-24 ENCOUNTER — Other Ambulatory Visit: Payer: Self-pay | Admitting: Family Medicine

## 2024-04-24 DIAGNOSIS — R109 Unspecified abdominal pain: Secondary | ICD-10-CM

## 2024-06-12 ENCOUNTER — Encounter (HOSPITAL_BASED_OUTPATIENT_CLINIC_OR_DEPARTMENT_OTHER): Payer: Self-pay | Admitting: Orthopaedic Surgery
